# Patient Record
Sex: Female | Born: 1955
Health system: Southern US, Community
[De-identification: ages and names within clinical notes are randomized; demographics above are authoritative.]

## PROBLEM LIST (undated history)

## (undated) DIAGNOSIS — E7800 Pure hypercholesterolemia, unspecified: Secondary | ICD-10-CM

## (undated) DIAGNOSIS — R109 Unspecified abdominal pain: Secondary | ICD-10-CM

## (undated) DIAGNOSIS — M1990 Unspecified osteoarthritis, unspecified site: Secondary | ICD-10-CM

## (undated) DIAGNOSIS — B009 Herpesviral infection, unspecified: Secondary | ICD-10-CM

## (undated) DIAGNOSIS — N871 Moderate cervical dysplasia: Secondary | ICD-10-CM

## (undated) DIAGNOSIS — I10 Essential (primary) hypertension: Secondary | ICD-10-CM

## (undated) DIAGNOSIS — A64 Unspecified sexually transmitted disease: Secondary | ICD-10-CM

## (undated) DIAGNOSIS — M8580 Other specified disorders of bone density and structure, unspecified site: Secondary | ICD-10-CM

## (undated) HISTORY — DX: Moderate cervical dysplasia: N87.1

## (undated) HISTORY — DX: Herpesviral infection, unspecified: B00.9

## (undated) HISTORY — DX: Unspecified abdominal pain: R10.9

## (undated) HISTORY — DX: Essential (primary) hypertension: I10

## (undated) HISTORY — DX: Other specified disorders of bone density and structure, unspecified site: M85.80

## (undated) HISTORY — PX: APPENDECTOMY: SHX54

## (undated) HISTORY — DX: Unspecified sexually transmitted disease: A64

## (undated) HISTORY — PX: OVARIAN CYST SURGERY: SHX726

## (undated) HISTORY — DX: Pure hypercholesterolemia, unspecified: E78.00

---

## 1978-10-13 DIAGNOSIS — B009 Herpesviral infection, unspecified: Secondary | ICD-10-CM

## 1978-10-13 DIAGNOSIS — A64 Unspecified sexually transmitted disease: Secondary | ICD-10-CM

## 1978-10-13 HISTORY — DX: Unspecified sexually transmitted disease: A64

## 1978-10-13 HISTORY — DX: Herpesviral infection, unspecified: B00.9

## 1983-10-14 HISTORY — PX: TUBAL LIGATION: SHX77

## 1998-01-16 ENCOUNTER — Other Ambulatory Visit: Admission: RE | Admit: 1998-01-16 | Discharge: 1998-01-16 | Payer: Self-pay | Admitting: Internal Medicine

## 2000-03-03 ENCOUNTER — Encounter: Admission: RE | Admit: 2000-03-03 | Discharge: 2000-03-03 | Payer: Self-pay | Admitting: Family Medicine

## 2000-03-03 ENCOUNTER — Encounter: Payer: Self-pay | Admitting: Family Medicine

## 2002-11-25 ENCOUNTER — Other Ambulatory Visit: Admission: RE | Admit: 2002-11-25 | Discharge: 2002-11-25 | Payer: Self-pay | Admitting: Obstetrics and Gynecology

## 2003-12-15 ENCOUNTER — Encounter: Admission: RE | Admit: 2003-12-15 | Discharge: 2003-12-15 | Payer: Self-pay | Admitting: *Deleted

## 2004-01-02 ENCOUNTER — Other Ambulatory Visit: Admission: RE | Admit: 2004-01-02 | Discharge: 2004-01-02 | Payer: Self-pay | Admitting: Obstetrics and Gynecology

## 2005-01-02 ENCOUNTER — Other Ambulatory Visit: Admission: RE | Admit: 2005-01-02 | Discharge: 2005-01-02 | Payer: Self-pay | Admitting: Obstetrics and Gynecology

## 2006-01-06 ENCOUNTER — Other Ambulatory Visit: Admission: RE | Admit: 2006-01-06 | Discharge: 2006-01-06 | Payer: Self-pay | Admitting: Obstetrics & Gynecology

## 2006-05-14 ENCOUNTER — Encounter: Admission: RE | Admit: 2006-05-14 | Discharge: 2006-05-14 | Payer: Self-pay | Admitting: Obstetrics and Gynecology

## 2007-04-27 ENCOUNTER — Other Ambulatory Visit: Admission: RE | Admit: 2007-04-27 | Discharge: 2007-04-27 | Payer: Self-pay | Admitting: Obstetrics and Gynecology

## 2007-05-19 ENCOUNTER — Encounter: Admission: RE | Admit: 2007-05-19 | Discharge: 2007-05-19 | Payer: Self-pay | Admitting: Obstetrics & Gynecology

## 2008-06-01 ENCOUNTER — Encounter: Admission: RE | Admit: 2008-06-01 | Discharge: 2008-06-01 | Payer: Self-pay | Admitting: Obstetrics and Gynecology

## 2008-06-20 ENCOUNTER — Other Ambulatory Visit: Admission: RE | Admit: 2008-06-20 | Discharge: 2008-06-20 | Payer: Self-pay | Admitting: Obstetrics & Gynecology

## 2008-10-03 ENCOUNTER — Encounter: Admission: RE | Admit: 2008-10-03 | Discharge: 2008-10-03 | Payer: Self-pay | Admitting: Internal Medicine

## 2008-10-13 DIAGNOSIS — N871 Moderate cervical dysplasia: Secondary | ICD-10-CM

## 2008-10-13 HISTORY — DX: Moderate cervical dysplasia: N87.1

## 2008-10-13 HISTORY — PX: ABDOMINAL HYSTERECTOMY: SHX81

## 2008-10-13 HISTORY — PX: LAPAROSCOPIC ASSISTED VAGINAL HYSTERECTOMY: SHX5398

## 2008-10-24 ENCOUNTER — Encounter: Payer: Self-pay | Admitting: Obstetrics & Gynecology

## 2008-10-24 ENCOUNTER — Ambulatory Visit (HOSPITAL_COMMUNITY): Admission: RE | Admit: 2008-10-24 | Discharge: 2008-10-25 | Payer: Self-pay | Admitting: Obstetrics & Gynecology

## 2009-10-31 ENCOUNTER — Encounter: Admission: RE | Admit: 2009-10-31 | Discharge: 2009-10-31 | Payer: Self-pay | Admitting: Obstetrics and Gynecology

## 2011-01-27 LAB — BASIC METABOLIC PANEL
CO2: 23 mEq/L (ref 19–32)
Calcium: 9.4 mg/dL (ref 8.4–10.5)
Chloride: 103 mEq/L (ref 96–112)
Creatinine, Ser: 0.85 mg/dL (ref 0.4–1.2)
GFR calc Af Amer: >60 mL/min (ref >60)
Glucose, Bld: 157 mg/dL — ABNORMAL HIGH (ref 70–99)
Potassium: 3.7 mEq/L (ref 3.5–5.1)
Sodium: 132 mEq/L — ABNORMAL LOW (ref 135–145)
Sodium: 135 mEq/L (ref 135–145)

## 2011-01-27 LAB — CBC
HCT: 30 % — ABNORMAL LOW (ref 36.0–46.0)
Hemoglobin: 10.4 g/dL — ABNORMAL LOW (ref 12.0–15.0)
MCHC: 33.8 g/dL (ref 30.0–36.0)
MCHC: 34.6 g/dL (ref 30.0–36.0)
MCV: 98.4 fL (ref 78.0–100.0)
MCV: 98.6 fL (ref 78.0–100.0)
Platelets: 279 10*3/uL (ref 150–400)
RDW: 13.2 % (ref 11.5–15.5)
WBC: 8.5 10*3/uL (ref 4.0–10.5)

## 2011-01-27 LAB — URINALYSIS, ROUTINE W REFLEX MICROSCOPIC
Bilirubin Urine: NEGATIVE
Hgb urine dipstick: NEGATIVE
Nitrite: NEGATIVE
Protein, ur: NEGATIVE mg/dL
Urobilinogen, UA: 0.2 mg/dL (ref 0.0–1.0)

## 2011-01-27 LAB — PROTIME-INR
INR: 1 (ref 0.00–1.49)
Prothrombin Time: 13.5 seconds (ref 11.6–15.2)

## 2011-01-27 LAB — PREGNANCY, URINE: Preg Test, Ur: NEGATIVE

## 2011-02-19 ENCOUNTER — Other Ambulatory Visit: Payer: Self-pay | Admitting: Obstetrics & Gynecology

## 2011-02-19 DIAGNOSIS — Z1231 Encounter for screening mammogram for malignant neoplasm of breast: Secondary | ICD-10-CM

## 2011-02-25 NOTE — Discharge Summary (Signed)
NAMEDAYRA, RAPLEY                ACCOUNT NO.:  [id number]   MEDICAL RECORD NO.:  [id number]          PATIENT TYPE:  OIB   LOCATION:  9318                          FACILITY:  [facility]   PHYSICIAN:  M. Leda Quail, MD  DATE OF BIRTH:  1956/05/28   DATE OF ADMISSION:  10/24/2008  DATE OF DISCHARGE:  10/25/2008                               DISCHARGE SUMMARY   ADMISSION DIAGNOSES:  71. A 55 year old G2, P2 married white female with a large cystic 11 cm      left-sided mass.  2. History of menorrhagia.  3. Fibroids.  4. Hypertension.   POSTOPERATIVE DIAGNOSES:  63. A 55 year old G2, P2 a white female with a large cystic 11 cm left-      sided mass.  2. History of menorrhagia.  3. Fibroids.  4. Hypertension.  5. Endometriosis.   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   HISTORY OF PRESENT ILLNESS:  The patient's H and P is written in the  chart, however, in brief, this patient is a 55 year old G2, P2 married  white female with a large cystic mass that was found by Dr. Valentina Lucks on  physical exam, the latter part of December.  She underwent a CAT scan  that showed and confirmed a pelvic/abdominal mass, it did appear cystic  without any nodularity associated with it.  There was no increased  vascularity to it.  There was no ascites and no solid component.  Was  very smooth feeling on exam.  I did obtain CA125 on her, which was also  normal.  I counseled her about the benign nature of this, but because of  its size, recommended removal.  She has been on birth control pills long-  term for control of menorrhagia, as she does have some fibroids in her  uterus as well.  She decided to proceed with a hysterectomy at the same  time.  We discussed removal of both ovaries because of age and concerns  for any changes or any possible occurrence of other cysts on  the right  side and ultimately she decided to proceed with this as well.  She is  recently been diagnosed with hypertension and is on hydrochlorothiazide  and triamterene combo medication.  Otherwise, her past medical history  is only significant for removal of a cyst and appendectomy at age 55.   HOSPITAL COURSE:  The patient was admitted to Same Day Surgery.  She was  taken to the operating room.  Initially, laparoscopic left salpingo-  oophorectomy was performed and this ovary was sent to Pathology for  frozen section with the above findings noted.  Then the right ovary, and  an LAVH was performed.  The patient tolerated the procedure well.  She  had 200 mL of blood loss.  After the surgery was complete, she went to  the recovery room and after appropriate time there, to the third floor  for the remainder of her hospitalization.   She was seen in the evening of  postoperative day 1.  She was awake and  alert.  She had excellent vital signs.  She had occasional bowel sounds.  She had a little bit of nausea.  Incisions were clean, dry, and intact.  She was making good urine output.  By postoperative day 1, her pain was  under excellent control.  Her nausea was completely resolved.  She was  awake and alert.  T-max was 98.4, blood pressure has ranged from 93 to  138 over 59 to 90.  Pulse 69-84.  She had excellent urine output with  2000 mL of urine output.   Postop labs showed a white count of 94, hemoglobin of 10.4 and platelet  count of 184.  Electrolytes; sodium 137, potassium 3.7, BUN and  creatinine 4 and 0.7 respectively.  Glucose 157.   On exam, her abdomen was soft, nontender, and nondistended with normal  bowel sounds.  Her incisions were clean, dry, and intact.  Her perineum  was dry.  Her Foley catheter was removed.  She was advanced to regular  diet.  Oxygen was removed.  She is up to ambulate.  If the patient is  able to tolerate regular diet this morning and void without  difficulty,  she will be discharged to home later today.   DISCHARGE INSTRUCTIONS:  Have been provided to her in the written and  verbal form.  She will see me in 1 week for initial postop followup.  She has been given prescriptions for Percocet 5/325 1-2 tablets p.o. 4-6  hours p.r.n. pain and Motrin 800 mg 1 tablet every 8 hours as needed for  pain.  The patient will be discharged to home later today with her  husband, if today proceeds as expected.      Lum Keas, MD  Electronically Signed     MSM/MEDQ  D:  10/25/2008  T:  10/25/2008  Job:  [id number]

## 2011-02-25 NOTE — Op Note (Signed)
[name], [name]                ACCOUNT NO.:  [id number]   MEDICAL RECORD NO.:  [id number]          PATIENT TYPE:  OIB   LOCATION:  9318                          FACILITY:  [facility]   PHYSICIAN:  M. Leda Quail, MD  DATE OF BIRTH:  01-24-1956   DATE OF PROCEDURE:  10/24/2008  DATE OF DISCHARGE:                               OPERATIVE REPORT   PER OPERATIVE DIAGNOSES:  68. A 55 year old gravida 2, para 2 married white female with large      cystic 11-cm, left adnexal mass.  2. Menorrhagia controlled with birth control pills.  3. History bilateral tubal ligation in 1985.  4. Fibroid uterus.  5. History of hypertension.  6. History of normal spontaneous vaginal delivery x2.   POSTOPERATIVE DIAGNOSES:  54. A 55 year old gravida 2, para 2 married white female with large      cystic 11-cm, left adnexal mass.  2. Menorrhagia controlled with birth control pills.  3. History bilateral tubal ligation in 1985.  4. Fibroid uterus.  5. History of hypertension.  6. History of normal spontaneous vaginal delivery x2.  7. Endometriosis.   PROCEDURES:  Laparoscopic-assisted vaginal hysterectomy with bilateral  salpingo-oophorectomy.   SURGEON:  M. Leda Quail, MD   ASSISTANT:  Edwena Felty. Romine, M.D.   ANESTHESIA:  General endotracheal, Dr. Arby Barrette oversaw the case.   FINDINGS:  1. A large, smooth left ovarian mass with some adhesions inferiorly to      the bowel.  These were filmy.  2. Cyst on the right ovary.  3. Fibroid uterus.  4. Endometriosis on the right uterosacral ligament.   SPECIMENS:  Uterus, cervix, bilateral tubes, and ovaries sent to  Pathology.  Frozen section of the left ovary revealed a benign serous  cyst.   ESTIMATED BLOOD LOSS:  200 mL   URINE OUTPUT:  500 mL of clear urine in Foley catheter.   FLUIDS:  2200 mL of LR.   COMPLICATIONS:  None.   INDICATIONS:  Carmen Ball is a very nice 55 year old G2, P2 married  white female who was diagnosed with a  pelvic/abdominal mass after seeing  Dr. Kirby Funk at the very end of December.  She would see him for a  regular physical exam and to discuss blood pressure elevation.  He  palpated a abdominal masses and send her for a CT, which was done on  October 03, 2008 showing 11.9 x 9.2 x 11.7 cm cystic lesion.  She had a  CA-125, which was normal and I have talked to her about the size of this  lesion as well as the probability of its benign nature.  She was  interested in surgical removal.  She also was interested in hysterectomy  because of being on long-term birth control pills secondary menorrhagia.  She had fibroids that were noted on the CAT scan as well.  Ultimately,  she decided to proceed with bilateral oophorectomy with her  hysterectomy.  She was counseled about the risks and benefits of this,  this was documented in her office chart.  She has undergone preoperative  evaluation and is  here today for surgery.   DESCRIPTION OF PROCEDURE:  The patient is taken to the operating room.  She is placed in supine position.  General endotracheal anesthesia  administered by the anesthesia staff without difficulty.  Legs  positioned in [place] stirrups.  SCD devices were on lower extremities  bilaterally.  Arms were tucked bilaterally.  Legs were initially lifted  to the high lithotomy position.  Abdomen, perineum, inner thighs, and  vagina are prepped in normal sterile fashion.  Attention is turned  towards the vagina.  A bivalve speculum was placed in the vagina.  The  anterior lip of cervix was grasped single-tooth tenaculum.  Hulka clamp  was attached to the uterus as a means of manipulating the uterus during  the surgery.  The tenaculum was removed from the cervix.  The bivalve  speculum was removed from vagina.  Foley catheter was inserted into the  bladder under sterile conditions.  Clear urine is noted to drain into  the catheter, which is on straight drain.  Legs were positioned in  the  low lithotomy position.  She is draped in normal sterile fashion.  Attention is turned towards the abdomen.  An exam under anesthesia was  performed.  The ovarian mass does not come across any further in the  midline, but it does lift up pretty easily and also feels quite smooth.  A decision was made to go ahead and proceed laparoscopically.  The  location for incision is made approximately 4 to 5 cm above the  umbilicus due to the size of the ovarian mass and the need to be able to  visualize with the laparoscope.  The skin was anesthetized with 0.25%  Marcaine.  Then a skin incision was made with a #11 blade.  Subcutaneous  fat tissue is dissected.  The fascia is identified with retraction of  using curved retractors.  The fascia was grasped with a Kocher clamp and  elevated.  The fascia was incised using curved Mayo scissors.  The  peritoneum is then popped through easily and the retractors placed intra-  abdominally.  Operator's index finger was used to inspect the abdomen  and there is no adhesions noted right at the level of the midline  incision.  A pursestring suture was placed around the fascia.  Then a  Roseanne Reno is placed through this incision, was attached to the skin with  the sutures around the pursestring suture.  Using a laparoscope  intraperitoneal placement was confirmed.  CO2 gas was attached and  pneumoperitoneum was achieved with low pressures.  The patient is placed  in Trendelenburg positioning.  Decision was made to go ahead and place  ports in the right and left lower quadrant.  The skin was  transilluminated to watch for vessels.  Then incision site locations are  chosen.  Skin was anesthetized with 0.25% Marcaine.  The skin incision  was made with a #11 blade.  A 5-mm trocars and ports are inserted into  the right and left lower quadrant under direct visualization of the  laparoscope.  Blunt trocars were used.  These were removed.  The blunt  probe and grasper  was used.  The adhesions of the ovaries to the bowels  inferiorly were noted.  These are freed using a tripolar gyrus without  difficulty.  They were relatively filmy and they were freed just by  using the cut setting.  Once the ovary was completely mobile and free,  decision was made to go  ahead and decompress it.  A spinal needle was  obtained.  This was passed through the midline of the abdomen and placed  into the cyst.  The cyst was drained.  Approximately 400 mL of clear  fluid was drained from the cyst collapsing it almost completely.  Once  this was done, the ureters on both sides were identified.  The left  ovary was placed on some stretch to the opposite side and the IP  ligament was well visualized.  Using a tripolar gyrus, the pedicle was  serially clamped, cauterized, and incised.  Cauterization used sound  indicator to ensure complete cautery was performed.  Once the ovary was  completely freed from the left sidewall then the utero-ovarian pedicle  was serially clamped, cauterized, and cut.  The ovary was brought  through the midline incision and sent for frozen section.  At this  point, attention was turned to the right ovary.  The IP ligament was  serially clamped, cauterized and incised until it was completely  traversed.  Then the right round ligament was clamped, cauterized, and  incised.  The broad ligament was then serially clamped, cauterized, and  incised down approximately to the level of the internal os of the  cervix.  At this point, peritoneum was elevated and using Endoscopic  scissors, peritoneal reflection was incised.  This began the creation of  the bladder flap.  Attention was then turned to the left side with the  uterus placed on stretch to the patient's right.  The round ligament on  the left side was serially clamped, cauterized, and incised.  Then a  broad ligament was serially clamped, cauterize incised again down to  about the level of the internal  os.  The peritoneum at this point was  incised to the midline to connect with previous incision done on the  opposite side.  At this point, the remainder of the procedure was  performed vaginally.  The instruments were removed from the abdomen.  Pneumoperitoneum was relieved.  Legs positioned in the high lithotomy  position.  Attention was then turned towards the vagina.  A heavy weight  speculum placed in the posterior vaginal fornix.  A Jacobson tenaculum  used to grasp the cervix.  The cervix was anesthetized with 1% lidocaine  mixed 1:1 with epinephrine (1:100,000 units).  The posterior cul-de-sac  was entered sharply.  The posterior peritoneum and posterior vaginal  mucosa were attached with figure-of-eight suture and #0 Vicryl.  Long  banana speculum was placed through this incision and a heavy weight  speculum was removed.  The uterosacral ligaments on each side were  serially clamped, incised and Heaney sutured with #0 Vicryl.  Then the  cervix was circumferentially incised with cautery.  Using Mayo scissors  the pubovesical cervical fascia was incised until the plane between  bladder and the cervix was identified.  A Dever retractor was placed in  this incision.  The cardinal ligaments were serially clamped, cut and  suture ligated with # 0 Vicryl.  The uterine arteries once they were  reached, were clamped, cut and suture ligated twice with #0 Vicryl.  Then the remainder of the broad ligament was serially clamped, cut, and  suture ligated with #0 Vicryl.  Once the pedicles remaining were quite  small, the fundus of the uterus was delivered through the vagina.  The  remaining pedicle was traversed with a Heaney clamp and then the pedicle  was cut and the pedicles were tied with  a free tie of #0 Vicryl.  There  was a small amount of bleeding at one pedicle on the patient's right  side.  This was grasped with an Allis clamp and a figure-of-eight suture  with #0 Vicryl was used to  make this hemostatic.  Then pelvis was  irrigated.  No bleeding was noted except along the inferior cuff edge.  The banana speculum was removed and a short heavy weight speculum was  placed.  The cuff was then run with a long 0 Vicryl starting at the 2  o'clock position.  The anterior vaginal mucosa and anterior peritoneum  were incorporated into the stitch.  A running interlocking stitch is  brought around the cervix incorporating the posterior vaginal mucosa and  posterior peritoneum and then ended at 10 o'clock.  At this point,  posterior vaginal mucosa was hemostatic.  The cuff was closed with 6  figure-of-eight sutures of #0 Vicryl.  No bleeding was noted from the  cuff.  Vagina was irrigated.  All instruments were removed from the  vagina.  A CO2 gas was attached back to the [place].  The  pneumoperitoneum was reachieved.  Under visualization of the  laparoscope, all pedicles were inspected.  No bleeding was noted.  The  cuff was hemostatic.  Ureter peristalsis noted bilaterally.  At this  point, the procedure was ended.  The 2 inferior ports were removed under  direct visualization of the laparoscope after all instruments were  removed.  The patient was placed in supine at this point.  The  pneumoperitoneum was relieved.  The midline port was left in place until  several deep breaths were given to the patient to help get any gas in  the abdomen out.  Then the midline port was removed.  The pursestring  suture was then brought together to close the fascial incision.  The  skin above the umbilicus was closed with subcuticular stitch of 3-0  Vicryl.  The skin was cleansed at all 3 incision locations and then  closed with Dermabond.  At this point, the patient was completely  positioned back in supine position.  Sponge, lap, needle and sponge  counts were correct x2.  She was awaken from anesthesia, extubated, and  taken to the recovery room in stable addition.      Lum Keas, MD  Electronically Signed     MSM/MEDQ  D:  10/24/2008  T:  10/25/2008  Job:  [id number]   cc:   Thora Lance, M.D.  Fax: [phone]

## 2011-03-13 ENCOUNTER — Ambulatory Visit
Admission: RE | Admit: 2011-03-13 | Discharge: 2011-03-13 | Disposition: A | Discharge status: Home / Self Care | Payer: BC Managed Care – PPO | Source: Ambulatory Visit | Attending: Obstetrics & Gynecology | Admitting: Obstetrics & Gynecology

## 2011-03-13 DIAGNOSIS — Z1231 Encounter for screening mammogram for malignant neoplasm of breast: Secondary | ICD-10-CM

## 2011-08-22 ENCOUNTER — Other Ambulatory Visit: Payer: Self-pay | Admitting: Obstetrics & Gynecology

## 2011-08-22 DIAGNOSIS — Z780 Asymptomatic menopausal state: Secondary | ICD-10-CM

## 2012-02-11 ENCOUNTER — Other Ambulatory Visit: Payer: Self-pay | Admitting: Obstetrics & Gynecology

## 2012-02-11 DIAGNOSIS — Z1231 Encounter for screening mammogram for malignant neoplasm of breast: Secondary | ICD-10-CM

## 2012-03-15 ENCOUNTER — Ambulatory Visit
Admission: RE | Admit: 2012-03-15 | Discharge: 2012-03-15 | Disposition: A | Discharge status: Home / Self Care | Payer: BC Managed Care – PPO | Source: Ambulatory Visit | Attending: Obstetrics & Gynecology | Admitting: Obstetrics & Gynecology

## 2012-03-15 DIAGNOSIS — Z780 Asymptomatic menopausal state: Secondary | ICD-10-CM

## 2012-03-15 DIAGNOSIS — Z1231 Encounter for screening mammogram for malignant neoplasm of breast: Secondary | ICD-10-CM

## 2012-09-22 ENCOUNTER — Encounter (HOSPITAL_COMMUNITY): Payer: Self-pay | Admitting: Emergency Medicine

## 2012-09-22 ENCOUNTER — Emergency Department (HOSPITAL_COMMUNITY)
Admission: EM | Admit: 2012-09-22 | Discharge: 2012-09-22 | Disposition: A | Discharge status: Home / Self Care | Payer: BC Managed Care – PPO | Attending: Emergency Medicine | Admitting: Emergency Medicine

## 2012-09-22 ENCOUNTER — Emergency Department (HOSPITAL_COMMUNITY): Payer: BC Managed Care – PPO

## 2012-09-22 DIAGNOSIS — K8050 Calculus of bile duct without cholangitis or cholecystitis without obstruction: Secondary | ICD-10-CM

## 2012-09-22 DIAGNOSIS — R110 Nausea: Secondary | ICD-10-CM

## 2012-09-22 DIAGNOSIS — Z79899 Other long term (current) drug therapy: Secondary | ICD-10-CM | POA: Insufficient documentation

## 2012-09-22 DIAGNOSIS — M549 Dorsalgia, unspecified: Secondary | ICD-10-CM

## 2012-09-22 DIAGNOSIS — K8020 Calculus of gallbladder without cholecystitis without obstruction: Secondary | ICD-10-CM

## 2012-09-22 DIAGNOSIS — R1011 Right upper quadrant pain: Secondary | ICD-10-CM

## 2012-09-22 LAB — COMPREHENSIVE METABOLIC PANEL
Albumin: 4.2 g/dL (ref 3.5–5.2)
BUN: 17 mg/dL (ref 6–23)
Chloride: 100 mEq/L (ref 96–112)
Creatinine, Ser: 0.67 mg/dL (ref 0.50–1.10)
Total Bilirubin: 0.5 mg/dL (ref 0.3–1.2)

## 2012-09-22 LAB — URINALYSIS, ROUTINE W REFLEX MICROSCOPIC
Hgb urine dipstick: NEGATIVE
Nitrite: NEGATIVE
Protein, ur: NEGATIVE mg/dL
Specific Gravity, Urine: 1.03 (ref 1.005–1.030)
Urobilinogen, UA: 0.2 mg/dL (ref 0.0–1.0)

## 2012-09-22 LAB — LIPASE, BLOOD: Lipase: 41 U/L (ref 11–59)

## 2012-09-22 MED ORDER — HYDROMORPHONE HCL PF 1 MG/ML IJ SOLN
0.5000 mg | Freq: Once | INTRAMUSCULAR | Status: AC
  Administered 2012-09-22: 0.5 mg via INTRAVENOUS
  Filled 2012-09-22: qty 1

## 2012-09-22 MED ORDER — ONDANSETRON HCL 4 MG/2ML IJ SOLN
4.0000 mg | Freq: Once | INTRAMUSCULAR | Status: AC
  Administered 2012-09-22: 4 mg via INTRAVENOUS
  Filled 2012-09-22: qty 2

## 2012-09-22 MED ORDER — ONDANSETRON 4 MG PO TBDP: 4.0000 mg | ORAL_TABLET | Freq: Three times a day (TID) | ORAL | Status: DC | PRN

## 2012-09-22 MED ORDER — KETOROLAC TROMETHAMINE 15 MG/ML IJ SOLN
15.0000 mg | Freq: Once | INTRAMUSCULAR | Status: AC
  Administered 2012-09-22: 15 mg via INTRAVENOUS
  Filled 2012-09-22 (×2): qty 1

## 2012-09-22 MED ORDER — OXYCODONE-ACETAMINOPHEN 5-325 MG PO TABS: 1.0000 | ORAL_TABLET | ORAL | Status: DC | PRN

## 2012-09-22 MED ORDER — SODIUM CHLORIDE 0.9 % IV SOLN
Freq: Once | INTRAVENOUS | Status: AC
  Administered 2012-09-22: 11:00:00 via INTRAVENOUS

## 2012-09-22 NOTE — ED Provider Notes (Signed)
History     CSN: [id number]  Arrival date & time 09/22/12  0901   First MD Initiated Contact with Patient 09/22/12 [id number]      Chief Complaint  Patient presents with  . Back Pain  . Abdominal Cramping    8 hr hx of back and abd pain   (Consider location/radiation/quality/duration/timing/severity/associated sxs/prior treatment)  HPIJune F Ball is a 56 y.o. female with a remote smoking history, status post abdominal hysterectomy and appendectomy presents with severe pain in the right upper quadrant radiating to the back. Patient has some history of mild occasional neck pain but nothing chronic. Patient says she has no history of stomach problems, liver problems or gallbladder disease. She has some mild hypertension somewhat result his ALT with many weight loss medicine. She said this pain awoke her this morning was about to 7-8/10, as an 8 to start in the back she is pointing to the thoracic and lumbar junction, and goes around to underneath the right rib cage, he can be positional.  She does not have a history of kidney stones. She denies any fevers or chills. She does endorse some nausea and vomiting. No diarrhea. No chest pains or shortness of breath.  History reviewed. No pertinent past medical history.  Past Surgical History  Procedure Date  . Abdominal hysterectomy   . Ovarian cyst surgery   . Appendectomy     Family History  Problem Relation Age of Onset  . Cancer Father   . Hypertension Brother   . Hypertension Other     History  Substance Use Topics  . Smoking status: Never Smoker   . Smokeless tobacco: Not on file  . Alcohol Use: Yes    OB History    Grav Para Term Preterm Abortions TAB SAB Ect Mult Living                  Review of Systems At least 10pt or greater review of systems completed and are negative except where specified in the HPI.  Allergies  Review of patient's allergies indicates no known allergies.  Home Medications   Current Outpatient  Rx  Name  Route  Sig  Dispense  Refill  . ESTRADIOL 1 MG PO TABS   Oral   Take 1 mg by mouth daily.         Marland Kitchen LISINOPRIL-HYDROCHLOROTHIAZIDE 10-12.5 MG PO TABS   Oral   Take 1 tablet by mouth daily.           BP 139/79  Pulse 89  Temp 97.7 F (36.5 C) (Oral)  Resp 20  SpO2 98%  Physical Exam  ED Course  [place] bedside Date/Time: 09/22/2012 11:29 AM Performed by: Jones Skene Authorized by: Jones Skene Consent: Verbal consent obtained. Consent given by: patient Comments: Bedside ultrasound reveals a normal abdominal aorta, images have been recorded, aortic diameter was less than 2 cm from the diaphragm and just superior to the SMA down to the bifurcation of the iliacs. Also did bedside ultrasound of the gallbladder which shows innumerable gallstones, normal thickness gallbladder wall, no pericholecystic fluid and questionable dilated duct.    (including critical care time)  Labs Reviewed  COMPREHENSIVE METABOLIC PANEL - Abnormal; Notable for the following:    Sodium 134 (*)     Glucose, Bld 103 (*)     All other components within normal limits  URINALYSIS, ROUTINE W REFLEX MICROSCOPIC - Abnormal; Notable for the following:    APPearance CLOUDY (*)  Ketones, ur TRACE (*)     All other components within normal limits  LIPASE, BLOOD  LAB REPORT - SCANNED   US Abdomen Complete  09/22/2012  *RADIOLOGY REPORT*  Clinical Data:  Crampy abdominal pain.  COMPLETE ABDOMINAL ULTRASOUND  Comparison:  No priors.  Findings:  Gallbladder:  Multiple small echogenic foci within the gallbladder demonstrating distal acoustic shadowing, compatible with small gallstones.  Gallbladder does not appear distended.  Normal wall thickness.  No pericholecystic fluid.  Negative Murphy's sign.  Common bile duct:  Normal caliber measuring 6.1 mm in the porta hepatis.  Liver:  Normal size and echotexture without focal parenchymal abnormality.  Patent portal vein with hepatopetal flow.  IVC:   Patent throughout its visualized course in the abdomen.  Pancreas:  Although the pancreas is difficult to visualize in its entirety, no focal pancreatic abnormality is identified.  Spleen:  Normal size and echotexture without focal parenchymal abnormality.  6.7 cm.  Right Kidney:  No hydronephrosis.  Well-preserved cortex.  Normal size and parenchymal echotexture without focal abnormalities.  9.7 cm in length.  Left Kidney:  No hydronephrosis.  Well-preserved cortex.  Normal size and parenchymal echotexture without focal abnormalities. 10.6 cm in length.  Abdominal aorta:  Normal caliber measuring up to 1.8 cm in diameter and tapers appropriately distally.  IMPRESSION: 1.  Cholelithiasis without findings to suggest acute cholecystitis. 2.  No other acute findings in the abdomen.   Original Report Authenticated By: Trudie Reed, M.D.      1. Cholelithiasis   2. Biliary colic   3. Right upper quadrant pain   4. Back pain   5. Nausea       MDM  Kripa F Hamelin is a 56 y.o. female presenting with some right upper quadrant pain concern for biliary colic versus acute cholecystitis versus acute gallstone pancreatitis. Patient's pain has somewhat resolved with pain medicine, CT scan in the emergency department. Urinalysis is negative for blood, and couplets metabolic panel is unremarkable. Patient was sent for formal study of her upper quadrant because a bedside ultrasound patient's gallbladder does have significant amount of stones. It did appear like the duct may have been slightly enlarged as it's cured by stones on my bedside exam. Duct appears normal on formal ultrasound study with no cholecystitis, pericholecystic fluid et Karie Soda. Without any increased liver enzymes, or evidence of cholecystitis a right upper quadrant ultrasound, patient will be amenable to outpatient treatment for her symptomatic cholelithiasis and biliary colic. Given her referral to [facility] [place] [facility] and instructions on diet  to follow which may minimize her symptoms. Patient also gives prescriptions for pain medicine and nausea medicine as well.  I explained the diagnosis and have given explicit precautions to return to the ER including worsening right upper quadrant pain, fever, vomiting is not amenable to medications, altered mental status or any other new or worsening symptoms. The patient understands and accepts the medical plan as it's been dictated and I have answered their questions. Discharge instructions concerning home care and prescriptions have been given.  The patient is STABLE and is discharged to home in good condition.         Jones Skene, MD 09/23/12 1818

## 2012-09-22 NOTE — ED Notes (Signed)
Pt reports waking to severe midback and abd pain at 0100. Pain increased over last two hrs. Denies NVD  Dr Kirby Funk PCP advised by pt

## 2012-09-22 NOTE — ED Notes (Signed)
US at bedside

## 2012-09-22 NOTE — ED Notes (Signed)
Per patient, feels much better, positive response to pain meds

## 2012-09-22 NOTE — ED Notes (Signed)
Pt c/o waking up with severe mid back pain and epigastric pain.  Denies n/v/d.  Denies GU symptoms.

## 2012-09-23 ENCOUNTER — Encounter (INDEPENDENT_AMBULATORY_CARE_PROVIDER_SITE_OTHER): Payer: Self-pay | Admitting: General Surgery

## 2012-09-23 ENCOUNTER — Ambulatory Visit (INDEPENDENT_AMBULATORY_CARE_PROVIDER_SITE_OTHER): Payer: BC Managed Care – PPO | Admitting: General Surgery

## 2012-09-23 VITALS — BP 120/76 | HR 72 | Temp 97.3°F | Resp 16 | Ht 62.0 in | Wt 138.0 lb

## 2012-09-23 DIAGNOSIS — K8020 Calculus of gallbladder without cholecystitis without obstruction: Secondary | ICD-10-CM | POA: Insufficient documentation

## 2012-09-23 NOTE — Progress Notes (Signed)
Patient ID: Carmen Ball, female   DOB: 1955-12-26, 56 y.o.   MRN: [id number]  Chief Complaint  Patient presents with  . Abdominal Pain    HPI Carmen Ball is a 56 y.o. female.   HPI 56 yo WF referred by Dr Rulon Abide for evaluation of gallstones. The patient was seen in the emergency room yesterday. She awoke around 1 AM with severe back pain that radiated to her upper abdomen. And became concentrated in her upper abdomen underneath her right rib cage.  She states the pain was so intense she felt like she was going to vomit.the pain was so intense it made her go to the emergency room after discussing her symptoms with her primary care physician. She states that she probably has had some milder episodes of right upper quadrant pain in the past but they were mainly very brief in duration and not as intense. She denies any weight loss. She denies any fevers or chills. She denies any melena, hematochezia, acholic stools. She denies any trouble swallowing liquids or solids. She denies any reflux. Past Medical History  Diagnosis Date  . Hypertension   . Abdominal pain     Past Surgical History  Procedure Date  . Ovarian cyst surgery   . Appendectomy   . Abdominal hysterectomy 2010    Family History  Problem Relation Age of Onset  . Cancer Father   . Hypertension Brother   . Hypertension Other   . Heart disease Mother     Social History History  Substance Use Topics  . Smoking status: Former Smoker    Quit date: 09/23/1994  . Smokeless tobacco: Never Used  . Alcohol Use: 1.8 oz/week    3 Glasses of wine per week     Comment: per day    No Known Allergies  Current Outpatient Prescriptions  Medication Sig Dispense Refill  . estradiol (ESTRACE) 1 MG tablet Take 1 mg by mouth daily.      Marland Kitchen lisinopril-hydrochlorothiazide (PRINZIDE,ZESTORETIC) 10-12.5 MG per tablet Take 1 tablet by mouth daily.      . ondansetron (ZOFRAN ODT) 4 MG disintegrating tablet Take 1 tablet (4 mg total) by  mouth every 8 (eight) hours as needed for nausea.  10 tablet  0  . oxyCODONE-acetaminophen (PERCOCET/ROXICET) 5-325 MG per tablet Take 1-2 tablets by mouth every 4 (four) hours as needed for pain.  29 tablet  0   No current facility-administered medications for this visit.   Facility-Administered Medications Ordered in Other Visits  Medication Dose Route Frequency Provider Last Rate Last Dose  . [COMPLETED] 0.9 %  sodium chloride infusion   Intravenous Once John-Adam Bonk, MD      . [COMPLETED] HYDROmorphone (DILAUDID) injection 0.5 mg  0.5 mg Intravenous Once John-Adam Bonk, MD   0.5 mg at 09/22/12 1105  . [COMPLETED] ketorolac (TORADOL) 15 MG/ML injection 15 mg  15 mg Intravenous Once John-Adam Bonk, MD   15 mg at 09/22/12 1234  . [COMPLETED] ondansetron (ZOFRAN) injection 4 mg  4 mg Intravenous Once John-Adam Bonk, MD   4 mg at 09/22/12 1105    Review of Systems Review of Systems  Constitutional: Negative for activity change and appetite change.  HENT: Negative for hearing loss and neck pain.   Eyes: Negative for photophobia, itching and visual disturbance.  Respiratory: Negative for chest tightness, shortness of breath, wheezing and stridor.   Cardiovascular: Negative for chest pain, palpitations and leg swelling.  Gastrointestinal:  See hpi  Genitourinary: Negative for dysuria, hematuria and difficulty urinating.  Musculoskeletal: Negative for joint swelling and arthralgias.  Skin: Negative for color change and pallor.  Neurological: Negative for tremors, seizures, weakness, light-headedness and headaches.  Hematological: Negative for adenopathy. Does not bruise/bleed easily.  Psychiatric/Behavioral: Negative for suicidal ideas. The patient is not nervous/anxious.     Blood pressure 120/76, pulse 72, temperature 97.3 F (36.3 C), temperature source Temporal, resp. rate 16, height 5\' 2"  (1.575 m), weight 138 lb (62.596 kg).  Physical Exam Physical Exam  Vitals  reviewed. Constitutional: She is oriented to person, place, and time. She appears well-developed and well-nourished. No distress.  HENT:  Head: Normocephalic and atraumatic.  Right Ear: External ear normal.  Eyes: Conjunctivae normal are normal. No scleral icterus.  Neck: Neck supple. No tracheal deviation present.  Cardiovascular: Normal rate, regular rhythm and normal heart sounds.   Pulmonary/Chest: Effort normal and breath sounds normal. No respiratory distress. She has no wheezes.  Abdominal: Soft. Bowel sounds are normal. She exhibits no distension. There is no tenderness. There is no rebound and no guarding.    Musculoskeletal: She exhibits no edema.  Neurological: She is alert and oriented to person, place, and time.  Skin: Skin is warm and dry. No rash noted. She is not diaphoretic. No erythema.  Psychiatric: She has a normal mood and affect. Her behavior is normal. Judgment and thought content normal.    Data Reviewed ED note and labs  COMPLETE ABDOMINAL ULTRASOUND  Comparison: No priors.  Findings:  Gallbladder: Multiple small echogenic foci within the gallbladder  demonstrating distal acoustic shadowing, compatible with small  gallstones. Gallbladder does not appear distended. Normal wall  thickness. No pericholecystic fluid. Negative Murphy's sign.  Common bile duct: Normal caliber measuring 6.1 mm in the porta  hepatis.  Liver: Normal size and echotexture without focal parenchymal  abnormality. Patent portal vein with hepatopetal flow.  IVC: Patent throughout its visualized course in the abdomen.  Pancreas: Although the pancreas is difficult to visualize in its  entirety, no focal pancreatic abnormality is identified.  Spleen: Normal size and echotexture without focal parenchymal  abnormality. 6.7 cm.  Right Kidney: No hydronephrosis. Well-preserved cortex. Normal  size and parenchymal echotexture without focal abnormalities. 9.7  cm in length.  Left Kidney: No  hydronephrosis. Well-preserved cortex. Normal  size and parenchymal echotexture without focal abnormalities. 10.6  cm in length.  Abdominal aorta: Normal caliber measuring up to 1.8 cm in diameter  and tapers appropriately distally.  IMPRESSION:  1. Cholelithiasis without findings to suggest acute cholecystitis.  2. No other acute findings in the abdomen.   Assessment    Symptomatic cholelithiasis    Plan    I believe the patient's symptoms are consistent with gallbladder disease.  We discussed gallbladder disease. The patient was given Agricultural engineer. We discussed non-operative and operative management. We discussed the signs & symptoms of acute cholecystitis  I discussed laparoscopic cholecystectomy with IOC in detail.  The patient was given educational material as well as diagrams detailing the procedure.  We discussed the risks and benefits of a laparoscopic cholecystectomy including, but not limited to bleeding, infection, injury to surrounding structures such as the intestine or liver, bile leak, retained gallstones, need to convert to an open procedure, prolonged diarrhea, blood clots such as  DVT, common bile duct injury, anesthesia risks, and possible need for additional procedures.  We discussed the typical post-operative recovery course. I explained that the likelihood of improvement of their  symptoms is good.   [name]. Andrey Campanile, MD, FACS General, Bariatric, & Minimally Invasive Surgery [facility], [place]        [facility] M 09/23/2012, 10:28 AM

## 2012-09-23 NOTE — Patient Instructions (Signed)

## 2012-10-01 ENCOUNTER — Encounter (HOSPITAL_COMMUNITY): Payer: Self-pay | Admitting: Pharmacy Technician

## 2012-10-04 ENCOUNTER — Encounter (HOSPITAL_COMMUNITY): Payer: Self-pay

## 2012-10-04 ENCOUNTER — Encounter (HOSPITAL_COMMUNITY)
Admission: RE | Admit: 2012-10-04 | Discharge: 2012-10-04 | Disposition: A | Discharge status: Home / Self Care | Payer: BC Managed Care – PPO | Source: Ambulatory Visit | Attending: General Surgery | Admitting: General Surgery

## 2012-10-04 ENCOUNTER — Ambulatory Visit (HOSPITAL_COMMUNITY)
Admission: RE | Admit: 2012-10-04 | Discharge: 2012-10-04 | Disposition: A | Discharge status: Home / Self Care | Payer: BC Managed Care – PPO | Source: Ambulatory Visit | Attending: Anesthesiology | Admitting: Anesthesiology

## 2012-10-04 DIAGNOSIS — I10 Essential (primary) hypertension: Secondary | ICD-10-CM | POA: Insufficient documentation

## 2012-10-04 DIAGNOSIS — Z01818 Encounter for other preprocedural examination: Secondary | ICD-10-CM | POA: Insufficient documentation

## 2012-10-04 DIAGNOSIS — Z01812 Encounter for preprocedural laboratory examination: Secondary | ICD-10-CM | POA: Insufficient documentation

## 2012-10-04 HISTORY — DX: Unspecified osteoarthritis, unspecified site: M19.90

## 2012-10-04 LAB — COMPREHENSIVE METABOLIC PANEL
AST: 14 U/L (ref 0–37)
Albumin: 3.9 g/dL (ref 3.5–5.2)
BUN: 14 mg/dL (ref 6–23)
Creatinine, Ser: 0.65 mg/dL (ref 0.50–1.10)
Potassium: 3.5 mEq/L (ref 3.5–5.1)
Total Protein: 7.2 g/dL (ref 6.0–8.3)

## 2012-10-04 LAB — CBC WITH DIFFERENTIAL/PLATELET
Basophils Absolute: 0 10*3/uL (ref 0.0–0.1)
Basophils Relative: 0 % (ref 0–1)
Eosinophils Absolute: 0 10*3/uL (ref 0.0–0.7)
Hemoglobin: 13.9 g/dL (ref 12.0–15.0)
MCH: 33.3 pg (ref 26.0–34.0)
MCHC: 34 g/dL (ref 30.0–36.0)
Monocytes Absolute: 0.7 10*3/uL (ref 0.1–1.0)
Monocytes Relative: 7 % (ref 3–12)
Neutro Abs: 6.5 10*3/uL (ref 1.7–7.7)
Neutrophils Relative %: 67 % (ref 43–77)
RDW: 12.2 % (ref 11.5–15.5)

## 2012-10-04 LAB — SURGICAL PCR SCREEN
MRSA, PCR: NEGATIVE
Staphylococcus aureus: NEGATIVE

## 2012-10-04 NOTE — Pre-Procedure Instructions (Addendum)
20 Carmen Ball  10/04/2012   Your procedure is scheduled on: Friday, October 15, 2012  Report to [facility] at  8:00 AM.  Call this number if you have problems the morning of surgery: [phone]   Remember:   Do not eat food or drink any liquids:After Midnight Thursday.    Take these medicines the morning of surgery with A SIP OF WATER: none             Stop taking any coumadin, aspirin, effient, plavix, and any herbal medications 4-5 days before surgery.   Do not wear jewelry, make-up or nail polish.  Do not wear lotions, powders, or perfumes. You may NOT wear deodorant.   Do not shave 48 hours prior to surgery. Do not bring valuables to the hospital.  Contacts, dentures or bridgework may not be worn into surgery.   Leave suitcase in the car. After surgery it may be brought to your room.  For patients admitted to the hospital, checkout time is 11:00 AM the day of discharge.   Patients discharged the day of surgery will not be allowed to drive home.  Name and phone number of your driver:   Special Instructions: Shower using CHG 2 nights before surgery and the night before surgery.  If you shower the day of surgery use CHG.  Use special wash - you have one bottle of CHG for all showers.  You should use approximately 1/3 of the bottle for each shower.   Please read over the following fact sheets that you were given: Pain Booklet, MRSA Information and Surgical Site Infection Prevention

## 2012-10-14 MED ORDER — DEXTROSE 5 % IV SOLN
2.0000 g | INTRAVENOUS | Status: AC
  Administered 2012-10-15: 2 g via INTRAVENOUS
  Filled 2012-10-14: qty 2

## 2012-10-15 ENCOUNTER — Ambulatory Visit (HOSPITAL_COMMUNITY): Payer: BC Managed Care – PPO

## 2012-10-15 ENCOUNTER — Encounter (HOSPITAL_COMMUNITY): Payer: Self-pay | Admitting: Certified Registered Nurse Anesthetist

## 2012-10-15 ENCOUNTER — Ambulatory Visit (HOSPITAL_COMMUNITY): Payer: BC Managed Care – PPO | Admitting: Certified Registered Nurse Anesthetist

## 2012-10-15 ENCOUNTER — Ambulatory Visit (HOSPITAL_COMMUNITY)
Admission: RE | Admit: 2012-10-15 | Discharge: 2012-10-15 | Disposition: A | Discharge status: Home / Self Care | Payer: BC Managed Care – PPO | Source: Ambulatory Visit | Attending: General Surgery | Admitting: General Surgery

## 2012-10-15 ENCOUNTER — Encounter (HOSPITAL_COMMUNITY)
Admission: RE | Disposition: A | Discharge status: Home / Self Care | Payer: Self-pay | Source: Ambulatory Visit | Attending: General Surgery

## 2012-10-15 DIAGNOSIS — I10 Essential (primary) hypertension: Secondary | ICD-10-CM | POA: Insufficient documentation

## 2012-10-15 DIAGNOSIS — Z90710 Acquired absence of both cervix and uterus: Secondary | ICD-10-CM | POA: Insufficient documentation

## 2012-10-15 DIAGNOSIS — K8010 Calculus of gallbladder with chronic cholecystitis without obstruction: Secondary | ICD-10-CM | POA: Insufficient documentation

## 2012-10-15 DIAGNOSIS — Z87891 Personal history of nicotine dependence: Secondary | ICD-10-CM | POA: Insufficient documentation

## 2012-10-15 DIAGNOSIS — Z9089 Acquired absence of other organs: Secondary | ICD-10-CM | POA: Insufficient documentation

## 2012-10-15 DIAGNOSIS — Z8249 Family history of ischemic heart disease and other diseases of the circulatory system: Secondary | ICD-10-CM | POA: Insufficient documentation

## 2012-10-15 DIAGNOSIS — Z809 Family history of malignant neoplasm, unspecified: Secondary | ICD-10-CM | POA: Insufficient documentation

## 2012-10-15 HISTORY — PX: CHOLECYSTECTOMY: SHX55

## 2012-10-15 SURGERY — LAPAROSCOPIC CHOLECYSTECTOMY WITH INTRAOPERATIVE CHOLANGIOGRAM
Anesthesia: General | Site: Abdomen | Wound class: Contaminated

## 2012-10-15 MED ORDER — OXYCODONE-ACETAMINOPHEN 5-325 MG PO TABS: 1.0000 | ORAL_TABLET | ORAL | Status: DC | PRN

## 2012-10-15 MED ORDER — HYDROMORPHONE HCL PF 1 MG/ML IJ SOLN
0.2500 mg | INTRAMUSCULAR | Status: DC | PRN
  Administered 2012-10-15 (×5): 0.5 mg via INTRAVENOUS
  Filled 2012-10-15 (×2): qty 1

## 2012-10-15 MED ORDER — LIDOCAINE HCL (CARDIAC) 20 MG/ML IV SOLN
INTRAVENOUS | Status: DC | PRN
  Administered 2012-10-15: 50 mg via INTRAVENOUS

## 2012-10-15 MED ORDER — SODIUM CHLORIDE 0.9 % IR SOLN
Status: DC | PRN
  Administered 2012-10-15: 1000 mL

## 2012-10-15 MED ORDER — LACTATED RINGERS IV SOLN
INTRAVENOUS | Status: DC | PRN
  Administered 2012-10-15 (×2): via INTRAVENOUS

## 2012-10-15 MED ORDER — PROPOFOL 10 MG/ML IV BOLUS
INTRAVENOUS | Status: DC | PRN
  Administered 2012-10-15: 160 mg via INTRAVENOUS

## 2012-10-15 MED ORDER — ACETAMINOPHEN 10 MG/ML IV SOLN
INTRAVENOUS | Status: AC
  Filled 2012-10-15: qty 100

## 2012-10-15 MED ORDER — SODIUM CHLORIDE 0.9 % IV SOLN
INTRAVENOUS | Status: DC | PRN
  Administered 2012-10-15: 09:00:00

## 2012-10-15 MED ORDER — FENTANYL CITRATE 0.05 MG/ML IJ SOLN
INTRAMUSCULAR | Status: DC | PRN
  Administered 2012-10-15: 100 ug via INTRAVENOUS
  Administered 2012-10-15: 50 ug via INTRAVENOUS

## 2012-10-15 MED ORDER — 0.9 % SODIUM CHLORIDE (POUR BTL) OPTIME
TOPICAL | Status: DC | PRN
  Administered 2012-10-15: 1000 mL

## 2012-10-15 MED ORDER — BUPIVACAINE-EPINEPHRINE 0.25% -1:200000 IJ SOLN
INTRAMUSCULAR | Status: DC | PRN
  Administered 2012-10-15: 28 mL

## 2012-10-15 MED ORDER — ONDANSETRON HCL 4 MG/2ML IJ SOLN
INTRAMUSCULAR | Status: DC | PRN
  Administered 2012-10-15: 4 mg via INTRAVENOUS

## 2012-10-15 MED ORDER — ROCURONIUM BROMIDE 100 MG/10ML IV SOLN
INTRAVENOUS | Status: DC | PRN
  Administered 2012-10-15: 40 mg via INTRAVENOUS
  Administered 2012-10-15: 10 mg via INTRAVENOUS

## 2012-10-15 MED ORDER — HYDROMORPHONE HCL PF 1 MG/ML IJ SOLN
INTRAMUSCULAR | Status: AC
  Administered 2012-10-15: 0.5 mg via INTRAVENOUS
  Filled 2012-10-15: qty 2

## 2012-10-15 MED ORDER — NEOSTIGMINE METHYLSULFATE 1 MG/ML IJ SOLN
INTRAMUSCULAR | Status: DC | PRN
  Administered 2012-10-15: 4 mg via INTRAVENOUS

## 2012-10-15 MED ORDER — ACETAMINOPHEN 10 MG/ML IV SOLN
1000.0000 mg | Freq: Once | INTRAVENOUS | Status: AC | PRN
  Administered 2012-10-15: 1000 mg via INTRAVENOUS

## 2012-10-15 MED ORDER — ONDANSETRON HCL 4 MG/2ML IJ SOLN: 4.0000 mg | Freq: Once | INTRAMUSCULAR | Status: DC | PRN

## 2012-10-15 MED ORDER — GLYCOPYRROLATE 0.2 MG/ML IJ SOLN
INTRAMUSCULAR | Status: DC | PRN
  Administered 2012-10-15: .6 mg via INTRAVENOUS

## 2012-10-15 MED ORDER — PHENYLEPHRINE HCL 10 MG/ML IJ SOLN
INTRAMUSCULAR | Status: DC | PRN
  Administered 2012-10-15 (×2): 80 ug via INTRAVENOUS

## 2012-10-15 MED ORDER — MIDAZOLAM HCL 5 MG/5ML IJ SOLN
INTRAMUSCULAR | Status: DC | PRN
  Administered 2012-10-15: 2 mg via INTRAVENOUS

## 2012-10-15 MED ORDER — LACTATED RINGERS IV SOLN
INTRAVENOUS | Status: DC
  Administered 2012-10-15: 09:00:00 via INTRAVENOUS

## 2012-10-15 SURGICAL SUPPLY — 49 items
APL SKNCLS STERI-STRIP NONHPOA (GAUZE/BANDAGES/DRESSINGS) ×1
APPLIER CLIP 5 13 M/L LIGAMAX5 (MISCELLANEOUS) ×2
APR CLP MED LRG 5 ANG JAW (MISCELLANEOUS) ×1
BAG SPEC RTRVL LRG 6X4 10 (ENDOMECHANICALS) ×1
BANDAGE ADHESIVE 1X3 (GAUZE/BANDAGES/DRESSINGS) ×6 IMPLANT
BENZOIN TINCTURE PRP APPL 2/3 (GAUZE/BANDAGES/DRESSINGS) ×2 IMPLANT
BLADE SURG ROTATE 9660 (MISCELLANEOUS) IMPLANT
CANISTER SUCTION 2500CC (MISCELLANEOUS) ×2 IMPLANT
CHLORAPREP W/TINT 26ML (MISCELLANEOUS) ×2 IMPLANT
CLIP APPLIE 5 13 M/L LIGAMAX5 (MISCELLANEOUS) ×1 IMPLANT
CLOTH BEACON ORANGE TIMEOUT ST (SAFETY) ×2 IMPLANT
COVER MAYO STAND STRL (DRAPES) ×2 IMPLANT
COVER SURGICAL LIGHT HANDLE (MISCELLANEOUS) ×2 IMPLANT
DECANTER SPIKE VIAL GLASS SM (MISCELLANEOUS) ×2 IMPLANT
DRAPE C-ARM 42X72 X-RAY (DRAPES) ×2 IMPLANT
DRAPE UTILITY 15X26 W/TAPE STR (DRAPE) ×4 IMPLANT
DRSG TEGADERM 4X4.75 (GAUZE/BANDAGES/DRESSINGS) ×2 IMPLANT
ELECT REM PT RETURN 9FT ADLT (ELECTROSURGICAL) ×2
ELECTRODE REM PT RTRN 9FT ADLT (ELECTROSURGICAL) ×1 IMPLANT
GAUZE SPONGE 2X2 8PLY STRL LF (GAUZE/BANDAGES/DRESSINGS) ×1 IMPLANT
GLOVE BIOGEL M STRL SZ7.5 (GLOVE) ×2 IMPLANT
GLOVE BIOGEL PI IND STRL 6.5 (GLOVE) IMPLANT
GLOVE BIOGEL PI IND STRL 7.0 (GLOVE) IMPLANT
GLOVE BIOGEL PI IND STRL 8 (GLOVE) ×1 IMPLANT
GLOVE BIOGEL PI INDICATOR 6.5 (GLOVE) ×1
GLOVE BIOGEL PI INDICATOR 7.0 (GLOVE) ×2
GLOVE BIOGEL PI INDICATOR 8 (GLOVE) ×1
GLOVE ECLIPSE 6.5 STRL STRAW (GLOVE) ×1 IMPLANT
GLOVE SURG SS PI 7.0 STRL IVOR (GLOVE) ×1 IMPLANT
GOWN PREVENTION PLUS XLARGE (GOWN DISPOSABLE) ×2 IMPLANT
GOWN STRL NON-REIN LRG LVL3 (GOWN DISPOSABLE) ×5 IMPLANT
KIT BASIN OR (CUSTOM PROCEDURE TRAY) ×2 IMPLANT
KIT ROOM TURNOVER OR (KITS) ×2 IMPLANT
NS IRRIG 1000ML POUR BTL (IV SOLUTION) ×2 IMPLANT
PAD ARMBOARD 7.5X6 YLW CONV (MISCELLANEOUS) ×2 IMPLANT
POUCH SPECIMEN RETRIEVAL 10MM (ENDOMECHANICALS) ×2 IMPLANT
SCISSORS LAP 5X35 DISP (ENDOMECHANICALS) ×1 IMPLANT
SET CHOLANGIOGRAPH 5 50 .035 (SET/KITS/TRAYS/PACK) ×2 IMPLANT
SET IRRIG TUBING LAPAROSCOPIC (IRRIGATION / IRRIGATOR) ×2 IMPLANT
SLEEVE ENDOPATH XCEL 5M (ENDOMECHANICALS) ×4 IMPLANT
SPECIMEN JAR SMALL (MISCELLANEOUS) ×2 IMPLANT
SPONGE GAUZE 2X2 STER 10/PKG (GAUZE/BANDAGES/DRESSINGS) ×1
SUT MNCRL AB 4-0 PS2 18 (SUTURE) ×2 IMPLANT
SUT VICRYL 0 UR6 27IN ABS (SUTURE) ×1 IMPLANT
TOWEL OR 17X24 6PK STRL BLUE (TOWEL DISPOSABLE) ×2 IMPLANT
TOWEL OR 17X26 10 PK STRL BLUE (TOWEL DISPOSABLE) ×2 IMPLANT
TRAY LAPAROSCOPIC (CUSTOM PROCEDURE TRAY) ×2 IMPLANT
TROCAR XCEL BLUNT TIP 100MML (ENDOMECHANICALS) ×2 IMPLANT
TROCAR XCEL NON-BLD 5MMX100MML (ENDOMECHANICALS) ×2 IMPLANT

## 2012-10-15 NOTE — Interval H&P Note (Signed)
History and Physical Interval Note:  10/15/2012 9:05 AM  Carmen Ball  has presented today for surgery, with the diagnosis of symptomatic cholelithisis  The various methods of treatment have been discussed with the patient and family. After consideration of risks, benefits and other options for treatment, the patient has consented to  Procedure(s) (LRB) with comments: LAPAROSCOPIC CHOLECYSTECTOMY WITH INTRAOPERATIVE CHOLANGIOGRAM (N/A) as a surgical intervention .  The patient's history has been reviewed, patient examined, no change in status, stable for surgery.  I have reviewed the patient's chart and labs.  Questions were answered to the patient's satisfaction.    [name]. Andrey Campanile, MD, FACS General, Bariatric, & Minimally Invasive Surgery [facility], [place]  [facility] M

## 2012-10-15 NOTE — Preoperative (Signed)
Beta Blockers   Reason not to administer Beta Blockers:Not Applicable 

## 2012-10-15 NOTE — Transfer of Care (Signed)
Immediate Anesthesia Transfer of Care Note  Patient: Carmen Ball  Procedure(s) Performed: Procedure(s) (LRB) with comments: LAPAROSCOPIC CHOLECYSTECTOMY WITH INTRAOPERATIVE CHOLANGIOGRAM (N/A)  Patient Location: PACU  Anesthesia Type:General  Level of Consciousness: awake, alert , oriented and patient cooperative  Airway & Oxygen Therapy: Patient Spontanous Breathing and Patient connected to nasal cannula oxygen  Post-op Assessment: Report given to PACU RN, Post -op Vital signs reviewed and stable and Patient moving all extremities X 4  Post vital signs: Reviewed and stable  Complications: No apparent anesthesia complications

## 2012-10-15 NOTE — Anesthesia Procedure Notes (Signed)
Procedure Name: Intubation Date/Time: 10/15/2012 9:37 AM Performed by: Rogelia Boga Pre-anesthesia Checklist: Patient identified, Emergency Drugs available, Suction available, Patient being monitored and Timeout performed Patient Re-evaluated:Patient Re-evaluated prior to inductionOxygen Delivery Method: Circle system utilized Preoxygenation: Pre-oxygenation with 100% oxygen Intubation Type: IV induction Ventilation: Mask ventilation without difficulty Laryngoscope Size: Mac and 4 Grade View: Grade I Tube type: Oral Number of attempts: 1 Airway Equipment and Method: Stylet Placement Confirmation: ETT inserted through vocal cords under direct vision,  positive ETCO2 and breath sounds checked- equal and bilateral Secured at: 21 cm Tube secured with: Tape Dental Injury: Teeth and Oropharynx as per pre-operative assessment

## 2012-10-15 NOTE — H&P (View-Only) (Signed)
Patient ID: Carmen Ball, female   DOB: 01/04/1956, 56 y.o.   MRN: [id number]  Chief Complaint  Patient presents with  . Abdominal Pain    HPI Carmen Ball is a 56 y.o. female.   HPI 56 yo WF referred by Dr Bonk for evaluation of gallstones. The patient was seen in the emergency room yesterday. She awoke around 1 AM with severe back pain that radiated to her upper abdomen. And became concentrated in her upper abdomen underneath her right rib cage.  She states the pain was so intense she felt like she was going to vomit.the pain was so intense it made her go to the emergency room after discussing her symptoms with her primary care physician. She states that she probably has had some milder episodes of right upper quadrant pain in the past but they were mainly very brief in duration and not as intense. She denies any weight loss. She denies any fevers or chills. She denies any melena, hematochezia, acholic stools. She denies any trouble swallowing liquids or solids. She denies any reflux. Past Medical History  Diagnosis Date  . Hypertension   . Abdominal pain     Past Surgical History  Procedure Date  . Ovarian cyst surgery   . Appendectomy   . Abdominal hysterectomy 2010    Family History  Problem Relation Age of Onset  . Cancer Father   . Hypertension Brother   . Hypertension Other   . Heart disease Mother     Social History History  Substance Use Topics  . Smoking status: Former Smoker    Quit date: 09/23/1994  . Smokeless tobacco: Never Used  . Alcohol Use: 1.8 oz/week    3 Glasses of wine per week     Comment: per day    No Known Allergies  Current Outpatient Prescriptions  Medication Sig Dispense Refill  . estradiol (ESTRACE) 1 MG tablet Take 1 mg by mouth daily.      . lisinopril-hydrochlorothiazide (PRINZIDE,ZESTORETIC) 10-12.5 MG per tablet Take 1 tablet by mouth daily.      . ondansetron (ZOFRAN ODT) 4 MG disintegrating tablet Take 1 tablet (4 mg total) by  mouth every 8 (eight) hours as needed for nausea.  10 tablet  0  . oxyCODONE-acetaminophen (PERCOCET/ROXICET) 5-325 MG per tablet Take 1-2 tablets by mouth every 4 (four) hours as needed for pain.  29 tablet  0   No current facility-administered medications for this visit.   Facility-Administered Medications Ordered in Other Visits  Medication Dose Route Frequency Provider Last Rate Last Dose  . [COMPLETED] 0.9 %  sodium chloride infusion   Intravenous Once John-Adam Bonk, MD      . [COMPLETED] HYDROmorphone (DILAUDID) injection 0.5 mg  0.5 mg Intravenous Once John-Adam Bonk, MD   0.5 mg at 09/22/12 1105  . [COMPLETED] ketorolac (TORADOL) 15 MG/ML injection 15 mg  15 mg Intravenous Once John-Adam Bonk, MD   15 mg at 09/22/12 1234  . [COMPLETED] ondansetron (ZOFRAN) injection 4 mg  4 mg Intravenous Once John-Adam Bonk, MD   4 mg at 09/22/12 1105    Review of Systems Review of Systems  Constitutional: Negative for activity change and appetite change.  HENT: Negative for hearing loss and neck pain.   Eyes: Negative for photophobia, itching and visual disturbance.  Respiratory: Negative for chest tightness, shortness of breath, wheezing and stridor.   Cardiovascular: Negative for chest pain, palpitations and leg swelling.  Gastrointestinal:         See hpi  Genitourinary: Negative for dysuria, hematuria and difficulty urinating.  Musculoskeletal: Negative for joint swelling and arthralgias.  Skin: Negative for color change and pallor.  Neurological: Negative for tremors, seizures, weakness, light-headedness and headaches.  Hematological: Negative for adenopathy. Does not bruise/bleed easily.  Psychiatric/Behavioral: Negative for suicidal ideas. The patient is not nervous/anxious.     Blood pressure 120/76, pulse 72, temperature 97.3 F (36.3 C), temperature source Temporal, resp. rate 16, height 5' 2" (1.575 m), weight 138 lb (62.596 kg).  Physical Exam Physical Exam  Vitals  reviewed. Constitutional: She is oriented to person, place, and time. She appears well-developed and well-nourished. No distress.  HENT:  Head: Normocephalic and atraumatic.  Right Ear: External ear normal.  Eyes: Conjunctivae normal are normal. No scleral icterus.  Neck: Neck supple. No tracheal deviation present.  Cardiovascular: Normal rate, regular rhythm and normal heart sounds.   Pulmonary/Chest: Effort normal and breath sounds normal. No respiratory distress. She has no wheezes.  Abdominal: Soft. Bowel sounds are normal. She exhibits no distension. There is no tenderness. There is no rebound and no guarding.    Musculoskeletal: She exhibits no edema.  Neurological: She is alert and oriented to person, place, and time.  Skin: Skin is warm and dry. No rash noted. She is not diaphoretic. No erythema.  Psychiatric: She has a normal mood and affect. Her behavior is normal. Judgment and thought content normal.    Data Reviewed ED note and labs  COMPLETE ABDOMINAL ULTRASOUND  Comparison: No priors.  Findings:  Gallbladder: Multiple small echogenic foci within the gallbladder  demonstrating distal acoustic shadowing, compatible with small  gallstones. Gallbladder does not appear distended. Normal wall  thickness. No pericholecystic fluid. Negative Murphy's sign.  Common bile duct: Normal caliber measuring 6.1 mm in the porta  hepatis.  Liver: Normal size and echotexture without focal parenchymal  abnormality. Patent portal vein with hepatopetal flow.  IVC: Patent throughout its visualized course in the abdomen.  Pancreas: Although the pancreas is difficult to visualize in its  entirety, no focal pancreatic abnormality is identified.  Spleen: Normal size and echotexture without focal parenchymal  abnormality. 6.7 cm.  Right Kidney: No hydronephrosis. Well-preserved cortex. Normal  size and parenchymal echotexture without focal abnormalities. 9.7  cm in length.  Left Kidney: No  hydronephrosis. Well-preserved cortex. Normal  size and parenchymal echotexture without focal abnormalities. 10.6  cm in length.  Abdominal aorta: Normal caliber measuring up to 1.8 cm in diameter  and tapers appropriately distally.  IMPRESSION:  1. Cholelithiasis without findings to suggest acute cholecystitis.  2. No other acute findings in the abdomen.   Assessment    Symptomatic cholelithiasis    Plan    I believe the patient's symptoms are consistent with gallbladder disease.  We discussed gallbladder disease. The patient was given educational material. We discussed non-operative and operative management. We discussed the signs & symptoms of acute cholecystitis  I discussed laparoscopic cholecystectomy with IOC in detail.  The patient was given educational material as well as diagrams detailing the procedure.  We discussed the risks and benefits of a laparoscopic cholecystectomy including, but not limited to bleeding, infection, injury to surrounding structures such as the intestine or liver, bile leak, retained gallstones, need to convert to an open procedure, prolonged diarrhea, blood clots such as  DVT, common bile duct injury, anesthesia risks, and possible need for additional procedures.  We discussed the typical post-operative recovery course. I explained that the likelihood of improvement of their   symptoms is good.   Garrette Caine M. Maciah Feeback, MD, FACS General, Bariatric, & Minimally Invasive Surgery [facility] [place]        Wisam Siefring M 09/23/2012, 10:28 AM    

## 2012-10-15 NOTE — Anesthesia Preprocedure Evaluation (Addendum)
Anesthesia Evaluation  Patient identified by MRN, date of birth, ID band Patient awake    Reviewed: Allergy & Precautions, H&P , NPO status , Patient's Chart, lab work & pertinent test results  Airway Mallampati: II TM Distance: >3 FB Neck ROM: Full    Dental  (+) Dental Advisory Given   Pulmonary former smoker,  breath sounds clear to auscultation        Cardiovascular hypertension, Pt. on medications Rhythm:Regular Rate:Normal     Neuro/Psych    GI/Hepatic   Endo/Other    Renal/GU      Musculoskeletal   Abdominal   Peds  Hematology   Anesthesia Other Findings   Reproductive/Obstetrics                          Anesthesia Physical Anesthesia Plan  ASA: II  Anesthesia Plan: General   Post-op Pain Management:    Induction: Intravenous  Airway Management Planned: Oral ETT  Additional Equipment:   Intra-op Plan:   Post-operative Plan: Extubation in OR  Informed Consent: I have reviewed the patients History and Physical, chart, labs and discussed the procedure including the risks, benefits and alternatives for the proposed anesthesia with the patient or authorized representative who has indicated his/her understanding and acceptance.   Dental advisory given  Plan Discussed with: CRNA and Surgeon  Anesthesia Plan Comments: (Symptomatic Cholelithiasis  Plan GA  Kipp Brood, MD)       Anesthesia Quick Evaluation

## 2012-10-15 NOTE — Anesthesia Postprocedure Evaluation (Signed)
  Anesthesia Post-op Note  Patient: Carmen Ball  Procedure(s) Performed: Procedure(s) (LRB) with comments: LAPAROSCOPIC CHOLECYSTECTOMY WITH INTRAOPERATIVE CHOLANGIOGRAM (N/A)  Patient Location: PACU  Anesthesia Type:General  Level of Consciousness: awake  Airway and Oxygen Therapy: Patient Spontanous Breathing  Post-op Pain: mild  Post-op Assessment: Post-op Vital signs reviewed  Post-op Vital Signs: stable  Complications: No apparent anesthesia complications

## 2012-10-15 NOTE — Op Note (Signed)
Laparoscopic Cholecystectomy with IOC Procedure Note  Indications: This patient presents with symptomatic gallbladder disease and will undergo laparoscopic cholecystectomy.  Pre-operative Diagnosis: symptomatic cholelithiasis  Post-operative Diagnosis: Same  Surgeon: Atilano Ina   Assistants: none  Anesthesia: General endotracheal anesthesia  ASA Class: 2  Procedure Details  The patient was seen again in the Holding Room. The risks, benefits, complications, treatment options, and expected outcomes were discussed with the patient. The possibilities of reaction to medication, pulmonary aspiration, perforation of viscus, bleeding, recurrent infection, finding a normal gallbladder, the need for additional procedures, failure to diagnose a condition, the possible need to convert to an open procedure, and creating a complication requiring transfusion or operation were discussed with the patient. The likelihood of improving the patient's symptoms with return to their baseline status is good.  The patient and/or family concurred with the proposed plan, giving informed consent. The site of surgery properly noted. The patient was taken to Operating Room, identified as Carmen Ball and the procedure verified as Laparoscopic Cholecystectomy with Intraoperative Cholangiogram. A Time Out was held and the above information confirmed.  Prior to the induction of general anesthesia, antibiotic prophylaxis was administered. General endotracheal anesthesia was then administered and tolerated well. After the induction, the abdomen was prepped with Chloraprep and draped in the sterile fashion. The patient was positioned in the supine position.  Local anesthetic agent was injected into the skin near the umbilicus and an incision made. We dissected down to the abdominal fascia with blunt dissection.  The fascia was incised vertically and we entered the peritoneal cavity bluntly.  A pursestring suture of 0-Vicryl  was placed around the fascial opening.  The Hasson cannula was inserted and secured with the stay suture.  Pneumoperitoneum was then created with CO2 and tolerated well without any adverse changes in the patient's vital signs. An 5-mm port was placed in the subxiphoid position.  Two 5-mm ports were placed in the right upper quadrant. All skin incisions were infiltrated with a local anesthetic agent before making the incision and placing the trocars.   We positioned the patient in reverse Trendelenburg, tilted slightly to the patient's left.  The gallbladder was identified, the fundus grasped and retracted cephalad. Adhesions were lysed bluntly and with the electrocautery where indicated, taking care not to injure any adjacent organs or viscus. The infundibulum was grasped and retracted laterally, exposing the peritoneum overlying the triangle of Calot. This was then divided and exposed in a blunt fashion. A critical view of the cystic duct and cystic artery was obtained.  The cystic duct was clearly identified and bluntly dissected circumferentially. The cystic duct was ligated with a clip distally.   An incision was made in the cystic duct and the [facility] cholangiogram catheter introduced. The catheter was secured using a clip. A cholangiogram was then obtained which showed good visualization of the distal and proximal biliary tree with no sign of filling defects or obstruction.  Contrast flowed easily into the duodenum. The catheter was then removed.   The cystic duct was then ligated with clips and divided. The cystic artery (both an anterior and posterior branch) was identified, dissected free, and each ligated with clips and divided as well.   The gallbladder was dissected from the liver bed in retrograde fashion with the electrocautery. The gallbladder was removed and placed in an Endocatch sac.  The gallbladder and Endocatch sac were then removed through the umbilical port site. The liver bed was irrigated  and inspected. Hemostasis was  achieved with the electrocautery. Copious irrigation was utilized and was repeatedly aspirated until clear.  The pursestring suture was used to close the umbilical fascia.    We again inspected the right upper quadrant for hemostasis.  The umbilical closure was inspected and there was no air leak and nothing trapped within the closure. However there was a little give so an additional interrupted 0-Vicryl suture was placed and viewed laparoscopically.  Pneumoperitoneum was released as we removed the trocars.  4-0 Monocryl was used to close the skin.   Benzoin, steri-strips, and clean dressings were applied. The patient was then extubated and brought to the recovery room in stable condition. Instrument, sponge, and needle counts were correct at closure and at the conclusion of the case.   Findings: Chronic Cholecystitis with Cholelithiasis  Estimated Blood Loss: Minimal         Drains: none         Specimens: Gallbladder           Complications: None; patient tolerated the procedure well.         Disposition: PACU - hemodynamically stable.         Condition: stable  Carmen Ball. Andrey Campanile, MD, FACS General, Bariatric, & Minimally Invasive Surgery [facility], [place]

## 2012-10-16 ENCOUNTER — Other Ambulatory Visit (INDEPENDENT_AMBULATORY_CARE_PROVIDER_SITE_OTHER): Payer: Self-pay | Admitting: Surgery

## 2012-10-16 ENCOUNTER — Telehealth (INDEPENDENT_AMBULATORY_CARE_PROVIDER_SITE_OTHER): Payer: Self-pay | Admitting: Surgery

## 2012-10-16 MED ORDER — HYDROCODONE-ACETAMINOPHEN 5-325 MG PO TABS: 1.0000 | ORAL_TABLET | ORAL | Status: DC | PRN

## 2012-10-16 NOTE — Telephone Encounter (Signed)
Pt called stating her pharmacy could not fill the oxycodone Rx E-Rx hydrococone #40

## 2012-10-18 ENCOUNTER — Encounter (HOSPITAL_COMMUNITY): Payer: Self-pay | Admitting: General Surgery

## 2012-11-04 ENCOUNTER — Encounter (INDEPENDENT_AMBULATORY_CARE_PROVIDER_SITE_OTHER): Payer: Self-pay | Admitting: General Surgery

## 2012-11-04 ENCOUNTER — Ambulatory Visit (INDEPENDENT_AMBULATORY_CARE_PROVIDER_SITE_OTHER): Payer: BC Managed Care – PPO | Admitting: General Surgery

## 2012-11-04 VITALS — BP 122/72 | HR 70 | Resp 16 | Ht 62.0 in | Wt 136.0 lb

## 2012-11-04 DIAGNOSIS — Z09 Encounter for follow-up examination after completed treatment for conditions other than malignant neoplasm: Secondary | ICD-10-CM

## 2012-11-04 NOTE — Progress Notes (Signed)
Subjective:     Patient ID: Carmen Ball, female   DOB: 10/13/1956, 57 y.o.   MRN: [id number]  HPI 57 year old Caucasian female comes in for followup after undergoing laparoscopic cholecystectomy for gallstone disease. This was done on January 3. There was an issue with her postoperative pain prescription not being given to the patient. Other than that she did quite well. She denies any current fever, chills, nausea, vomiting, diarrhea or constipation. She denies any abdominal pain.  Review of Systems     Objective:   Physical Exam BP 122/72  Pulse 70  Resp 16  Ht 5\' 2"  (1.575 m)  Wt 136 lb (61.689 kg)  BMI 24.87 kg/m2  Gen: alert, NAD, non-toxic appearing Pupils: equal, no scleral icterus Pulm: Lungs clear to auscultation, symmetric chest rise CV: regular rate and rhythm Abd: soft, nontender, nondistended. Well-healed trocar sites. No cellulitis. No incisional hernia Ext: no edema, no calf tenderness Skin: no rash, no jaundice     Assessment:     Status post laparoscopic cholecystectomy for chronic cholecystitis and cholelithiasis.    Plan:     We reviewed her pathology report. She was given a copy of it. It showed chronic cholecystitis and cholelithiasis. I have released her to full activities. I apologize for snafu regarding her pain medication prescription. followup PRN  [name]. Andrey Campanile, MD, FACS General, Bariatric, & Minimally Invasive Surgery [facility], [place]

## 2012-11-04 NOTE — Patient Instructions (Signed)
Can resume full activities 

## 2013-01-12 ENCOUNTER — Telehealth: Payer: Self-pay | Admitting: *Deleted

## 2013-01-12 NOTE — Telephone Encounter (Signed)
Please contact patient to see if she is requesting to restart hormone therapy.

## 2013-01-12 NOTE — Telephone Encounter (Signed)
Left message on patient's cell to call back. I need to ask if she is still taking Estradiol.  bp

## 2013-02-16 ENCOUNTER — Telehealth: Payer: Self-pay | Admitting: Obstetrics and Gynecology

## 2013-02-16 ENCOUNTER — Other Ambulatory Visit: Payer: Self-pay | Admitting: Obstetrics and Gynecology

## 2013-02-16 DIAGNOSIS — N951 Menopausal and female climacteric states: Secondary | ICD-10-CM

## 2013-02-16 MED ORDER — ESTRADIOL 1 MG PO TABS: 1.0000 mg | ORAL_TABLET | Freq: Every day | ORAL | Status: DC

## 2013-02-16 NOTE — Telephone Encounter (Signed)
Please inform the patient that her Estradiol has been refilled and sent to the pharmacy.

## 2013-02-16 NOTE — Telephone Encounter (Signed)
Left message on CB# to call our office concerning her Rx estradial.

## 2013-02-16 NOTE — Telephone Encounter (Signed)
Estradial RX question. Patient having trouble getting refilled. Reports she has been trying to get the rx for "several weeks". [facility]

## 2013-02-16 NOTE — Telephone Encounter (Signed)
Patient called to request if she may go back on medication Estradiol as she was before. States she is feeling hot at times and crying a lot. Feels that she needs this medication. [facility]acy [facility]. Please advise. sue

## 2013-02-17 NOTE — Telephone Encounter (Signed)
Left message on mobile CB# of Rx for Estradiol has been [facility]. Carmen Ball

## 2014-01-23 ENCOUNTER — Other Ambulatory Visit: Payer: Self-pay | Admitting: Obstetrics and Gynecology

## 2014-01-23 DIAGNOSIS — N951 Menopausal and female climacteric states: Secondary | ICD-10-CM

## 2014-01-23 MED ORDER — ESTRADIOL 1 MG PO TABS: 1.0000 mg | ORAL_TABLET | Freq: Every day | ORAL | Status: DC

## 2014-01-23 NOTE — Telephone Encounter (Signed)
Patient notified and is aware to keep appointment for 01/25/14

## 2014-01-23 NOTE — Telephone Encounter (Signed)
Patient is requesting refill of estradiol (ESTRACE) 1 MG tablet  Take 1 tablet (1 mg total) by mouth daily., Starting 02/16/2013, Until Discontinued, Normal, Last Dose: Not Recorded  Refills: 9 ordered [id number] [place], [place] - [address]  has aex scheduled for wed 01/25/14 but needs before she is out

## 2014-01-23 NOTE — Telephone Encounter (Signed)
Needs to keep appointment this month for further refills.

## 2014-01-23 NOTE — Telephone Encounter (Signed)
Last refilled: 02/16/13 Last AEX; 11/08/12 with Dr. Edward JollySilva Last Mammogram: 03/15/12 Bi-Rads 1  Aex scheduled for 01/25/14   Chart In Your Door  Please advise.

## 2014-01-24 ENCOUNTER — Encounter: Payer: Self-pay | Admitting: Obstetrics and Gynecology

## 2014-01-25 ENCOUNTER — Encounter: Payer: Self-pay | Admitting: Obstetrics and Gynecology

## 2014-01-25 ENCOUNTER — Ambulatory Visit (INDEPENDENT_AMBULATORY_CARE_PROVIDER_SITE_OTHER): Payer: BC Managed Care – PPO | Admitting: Obstetrics and Gynecology

## 2014-01-25 VITALS — BP 100/66 | HR 68 | Resp 16 | Ht 61.0 in | Wt 142.0 lb

## 2014-01-25 DIAGNOSIS — Z0000 Encounter for general adult medical examination without abnormal findings: Secondary | ICD-10-CM

## 2014-01-25 DIAGNOSIS — Z01419 Encounter for gynecological examination (general) (routine) without abnormal findings: Secondary | ICD-10-CM

## 2014-01-25 DIAGNOSIS — N951 Menopausal and female climacteric states: Secondary | ICD-10-CM

## 2014-01-25 LAB — POCT URINALYSIS DIPSTICK
BILIRUBIN UA: NEGATIVE
Blood, UA: NEGATIVE
Glucose, UA: NEGATIVE
Ketones, UA: NEGATIVE
LEUKOCYTES UA: NEGATIVE
NITRITE UA: NEGATIVE
PH UA: 8
PROTEIN UA: NEGATIVE
UROBILINOGEN UA: NEGATIVE

## 2014-01-25 MED ORDER — ESTROGENS, CONJUGATED 0.625 MG/GM VA CREA: TOPICAL_CREAM | VAGINAL | Status: DC

## 2014-01-25 MED ORDER — ESTRADIOL 1 MG PO TABS: 1.0000 mg | ORAL_TABLET | Freq: Every day | ORAL | Status: DC

## 2014-01-25 NOTE — Progress Notes (Signed)
Patient ID: Carmen Ball, female   DOB: 1956-07-07, 58 y.o.   MRN: [id number] GYNECOLOGY VISIT  PCP:   Lavone Orn, MD  Referring provider:   HPI: 58 y.o.   Married  Caucasian  female   G2P2002 with Patient's last menstrual period was 10/24/2008.   here for  AEX.  On HRT, estradiol 1 mg.  Wants to continue due to mood swings off estrogen.   Niece breast cancer at age 33 years old.   Status post laparoscopic cholecystectomy in February 2014.   No need for refill of HSV med.   Hgb:    PCP Urine:  Neg  GYNECOLOGIC HISTORY: Patient's last menstrual period was 10/24/2008. Sexually active:  yes Partner preference: female Contraception:   Hysterectomy Menopausal hormone therapy: Estradiol 1 mg. 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  Next colonoscopy due 2017.  Cholesterol check: 2015 wnl  Family History  Problem Relation Age of Onset  . Cancer Father   . Breast cancer Other 40    BRCA neg  . Heart disease Mother   . Diabetes Mother   . Hypertension Mother   . Hypertension Sister   . Stroke Maternal Grandmother   . Hypertension Brother     There are no active problems to display for this  patient.  Past Medical History  Diagnosis Date  . Hypertension   . Abdominal pain   . Arthritis     in the knees  . 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 Osteopenia   . CIN II (cervical intraepithelial neoplasia II) 2010    --seen on pathology after hysterectomy  . STD (sexually transmitted disease) 1980    hx HSV I of buttocks    Past Surgical History  Procedure Laterality Date  . Ovarian cyst surgery  Age 28  . Appendectomy    . Abdominal hysterectomy  2010  . Cholecystectomy  10/15/2012    Procedure: LAPAROSCOPIC CHOLECYSTECTOMY WITH INTRAOPERATIVE CHOLANGIOGRAM;  Surgeon: Gayland Curry, MD,FACS;  Location: [place];  Service: General;  Laterality: N/A;  . Tubal ligation  1985  . Laparoscopic assisted vaginal hysterectomy  1/10    focal CIN II on path  ALLERGIES: Review of patient's allergies indicates no known allergies.  Current Outpatient Prescriptions  Medication Sig Dispense Refill  . estradiol (ESTRACE) 1 MG tablet Take 1 tablet (1 mg total) by mouth daily.  30 tablet  0  . lisinopril-hydrochlorothiazide (PRINZIDE,ZESTORETIC) 10-12.5 MG per tablet Take 1 tablet by mouth daily.       No current facility-administered medications for this visit.     ROS:  Pertinent items are noted in HPI.  SOCIAL HISTORY:  [employer] and exercise psychologist.  Bought a house and did a major remodeling project.   PHYSICAL EXAMINATION:    BP 100/66  Pulse 68  Resp 16  Ht '5\' 1"'  (1.549 m)  Wt 142 lb (64.411 kg)  BMI 26.84 kg/m2  LMP 10/24/2008   Wt Readings from Last 3 Encounters:  01/25/14 142 lb (64.411 kg)  11/04/12 136 lb (61.689 kg)  10/04/12 138 lb 9.6 oz (62.869 kg)     Ht Readings from Last 3 Encounters:  01/25/14 '5\' 1"'  (1.549 m)  11/04/12 '5\' 2"'  (1.575 m)  10/04/12 '5\' 1"'  (1.549 m)    General appearance: alert, cooperative and appears stated age Head: Normocephalic, without obvious abnormality, atraumatic Neck: no adenopathy, supple, symmetrical, trachea midline and thyroid not  enlarged, symmetric, no tenderness/mass/nodules Lungs: clear to auscultation bilaterally Breasts: Inspection negative, No nipple retraction or dimpling, No nipple discharge or bleeding, No axillary or supraclavicular adenopathy, Normal to palpation without dominant masses Heart: regular rate and rhythm Abdomen: soft, non-tender; no masses,  no organomegaly Extremities: extremities normal, atraumatic, no cyanosis or edema Skin: Skin color, texture, turgor normal. No rashes or lesions Lymph nodes: Cervical, supraclavicular, and axillary nodes normal. No abnormal inguinal nodes palpated Neurologic: Grossly normal  Pelvic: External genitalia:   Perineum with separation of mucosa at 6 o'clock (states she had intercourse this am.)              Urethra:  normal appearing urethra with no masses, tenderness or lesions              Bartholins and Skenes: normal                 Vagina: normal appearing vagina with normal color and discharge, no lesions              Cervix: absent              Pap and high risk HPV testing done: yes.            Bimanual Exam:  Uterus: absent                                      Adnexa:  no masses                                      Rectovaginal: Confirms                                      Anus:  normal sphincter tone, no lesions  ASSESSMENT  Normal gynecologic exam. Status post LAVH/BSO. ERT patient.  Atrophic vulvar changes with skin splitting. History of CIN II. Osteopenia.   PLAN  Mammogram recommended yearly.  Scheduled for the patient.  I will refill the Estradiol for the  patient for one month.  Rx for Premarin cream 1/2 gm per vagina at hs for 2 weeks and then 1/2 gram twice weekly.  I will refill for the remainder of the year for the Estrace oral tabs and the Premarin vaginal cream after normal mammogram is back.  Pap smear and high risk HPV testing performed.  Counseled on self breast exam, Calcium and vitamin D intake, exercise. Bone density in  2 years.  Return annually or prn   An After Visit Summary was printed and given to the patient.

## 2014-01-25 NOTE — Patient Instructions (Signed)

## 2014-01-27 LAB — IPS PAP TEST WITH HPV

## 2014-01-31 ENCOUNTER — Ambulatory Visit
Admission: RE | Admit: 2014-01-31 | Discharge: 2014-01-31 | Disposition: A | Discharge status: Home / Self Care | Payer: BC Managed Care – PPO | Source: Ambulatory Visit | Attending: Obstetrics and Gynecology | Admitting: Obstetrics and Gynecology

## 2014-01-31 ENCOUNTER — Encounter (INDEPENDENT_AMBULATORY_CARE_PROVIDER_SITE_OTHER): Payer: Self-pay

## 2014-01-31 DIAGNOSIS — Z01419 Encounter for gynecological examination (general) (routine) without abnormal findings: Secondary | ICD-10-CM

## 2014-02-02 ENCOUNTER — Other Ambulatory Visit: Payer: Self-pay | Admitting: Obstetrics and Gynecology

## 2014-02-02 DIAGNOSIS — N951 Menopausal and female climacteric states: Secondary | ICD-10-CM

## 2014-02-02 MED ORDER — ESTRADIOL 1 MG PO TABS: 1.0000 mg | ORAL_TABLET | Freq: Every day | ORAL | Status: DC

## 2014-08-14 ENCOUNTER — Encounter: Payer: Self-pay | Admitting: Obstetrics and Gynecology

## 2015-02-12 ENCOUNTER — Encounter: Payer: Self-pay | Admitting: Obstetrics and Gynecology

## 2015-02-12 ENCOUNTER — Ambulatory Visit (INDEPENDENT_AMBULATORY_CARE_PROVIDER_SITE_OTHER): Payer: BLUE CROSS/BLUE SHIELD | Admitting: Obstetrics and Gynecology

## 2015-02-12 VITALS — BP 122/86 | HR 80 | Resp 16 | Ht 61.0 in | Wt 140.0 lb

## 2015-02-12 DIAGNOSIS — Z0000 Encounter for general adult medical examination without abnormal findings: Secondary | ICD-10-CM | POA: Diagnosis not present

## 2015-02-12 DIAGNOSIS — Z01419 Encounter for gynecological examination (general) (routine) without abnormal findings: Secondary | ICD-10-CM

## 2015-02-12 DIAGNOSIS — N951 Menopausal and female climacteric states: Secondary | ICD-10-CM

## 2015-02-12 LAB — POCT URINALYSIS DIPSTICK
BILIRUBIN UA: NEGATIVE
Glucose, UA: NEGATIVE
Ketones, UA: NEGATIVE
Leukocytes, UA: NEGATIVE
Nitrite, UA: NEGATIVE
PROTEIN UA: NEGATIVE
RBC UA: NEGATIVE
Urobilinogen, UA: NEGATIVE
pH, UA: 6

## 2015-02-12 MED ORDER — VALACYCLOVIR HCL 1 G PO TABS: ORAL_TABLET | ORAL | Status: DC

## 2015-02-12 MED ORDER — ESTRADIOL 1 MG PO TABS: 1.0000 mg | ORAL_TABLET | Freq: Every day | ORAL | Status: DC

## 2015-02-12 MED ORDER — ESTRADIOL 10 MCG VA TABS: ORAL_TABLET | VAGINAL | Status: DC

## 2015-02-12 NOTE — Patient Instructions (Signed)

## 2015-02-12 NOTE — Progress Notes (Signed)
59 y.o. G30P2002 Married Caucasian female here for annual exam.    Did not use Premarin cream last year.  Questions about this. On Estrace orally.  Some external irritation off and on.   CIN II on hysterectomy pathology.   Has a history of HSV treated with Valtrex.   PCP:   Lavone Orn  Patient's last menstrual period was 10/24/2008.          Sexually active: Yes.    The current method of family planning is status post hysterectomy.    Exercising: Yes.    Treadmill Smoker:  no  Health Maintenance: Pap: 01/25/14 Neg. HR HPV:Neg History of abnormal Pap:  no MMG:  02/01/14 BIRADS1:Neg Self Breast Exam: yes, monthly Colonoscopy:  2007 Normal - Due 2017 BMD:   03/15/12  Result   TDaP: 2007 Screening Labs:  Hb today: PCP, Urine today: neg   reports that she quit smoking about 20 years ago. She has never used smokeless tobacco. She reports that she drinks about 8.4 oz of alcohol per week. She reports that she does not use illicit drugs.  Past Medical History  Diagnosis Date  . Hypertension   . Abdominal pain   . Arthritis     in the knees  . 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 Osteopenia   . CIN II (cervical intraepithelial neoplasia II) 2010    --seen on pathology after hysterectomy  . STD (sexually transmitted disease) 1980    hx HSV I of buttocks    Past Surgical History  Procedure Laterality Date  . Ovarian cyst surgery  Age 55  . Appendectomy    . Abdominal hysterectomy  2010  . Cholecystectomy  10/15/2012    Procedure: LAPAROSCOPIC CHOLECYSTECTOMY WITH INTRAOPERATIVE CHOLANGIOGRAM;  Surgeon: Gayland Curry, MD,FACS;  Location: [place];  Service: General;  Laterality: N/A;  . Tubal ligation  1985  . Laparoscopic assisted vaginal hysterectomy  1/10    focal CIN II on path    Current Outpatient Prescriptions  Medication Sig Dispense Refill  . estradiol (ESTRACE) 1 MG tablet Take 1 tablet (1 mg total) by mouth daily. 30 tablet 10  . lisinopril-hydrochlorothiazide  (PRINZIDE,ZESTORETIC) 10-12.5 MG per tablet Take 1 tablet by mouth daily.    . valACYclovir (VALTREX) 1000 MG tablet Take 1,000 mg by mouth daily as needed.    . conjugated estrogens (PREMARIN) vaginal cream Use 1/2 g vaginally every night at bed time for the first 2 weeks, then use 1/2 g vaginally two or three times per week as needed to maintain symptom relief. (Patient not taking: Reported on 02/12/2015) 30 g 0   No current facility-administered medications for this visit.    Family History  Problem Relation Age of Onset  . Cancer Father   . Breast cancer Other 40    BRCA neg  . Heart disease Mother   . Diabetes Mother   . Hypertension Mother   . Hypertension Sister   . Stroke Maternal Grandmother   . Hypertension Brother     ROS:  Pertinent items are noted in HPI.  Otherwise, a comprehensive ROS was negative.  Exam:   BP 122/86 mmHg  Pulse 80  Resp 16  Ht '5\' 1"'  (1.549 m)  Wt 140 lb (63.504 kg)  BMI 26.47 kg/m2  LMP 10/24/2008    General appearance: alert, cooperative and appears stated age Head: Normocephalic, without obvious abnormality, atraumatic Neck: no adenopathy, supple, symmetrical, trachea midline and thyroid normal to inspection and  palpation Lungs: clear to auscultation bilaterally Breasts: normal appearance, no masses or tenderness, Inspection negative, No nipple retraction or dimpling, No nipple discharge or bleeding, No axillary or supraclavicular adenopathy Heart: regular rate and rhythm Abdomen: soft, non-tender; bowel sounds normal; no masses,  no organomegaly Extremities: extremities normal, atraumatic, no cyanosis or edema Skin: Skin color, texture, turgor normal. No rashes or lesions Lymph nodes: Cervical, supraclavicular, and axillary nodes normal. No abnormal inguinal nodes palpated Neurologic: Grossly normal  Pelvic: External genitalia:  no lesions              Urethra:  normal appearing urethra with no masses, tenderness or lesions               Bartholins and Skenes: normal                 Vagina: normal appearing vagina with normal color and discharge, no lesions              Cervix: absent              Pap taken: Yes.   Bimanual Exam:  Uterus:  uterus absent              Adnexa: normal adnexa and no mass, fullness, tenderness              Rectovaginal: Yes.  .  Confirms.              Anus:  normal sphincter tone, no lesions  Chaperone was present for exam.  Assessment:   Well woman visit with normal exam. Status post TAH.  Hx of CIN II on hysterectomy pathology specimen.  Atrophic vaginal symptoms. Hx HSV I.   Plan: Yearly mammogram recommended after age 76.  Recommended self breast exam.  Pap and HR HPV as above. Discussed CIN II and HPV.  Labs performed.  No..   See orders. Refills given on medications.  Yes.  .  See orders.  Valtrex.   Estrace oral and Vagifem.  Discussed risks of DVT, PE, MI, and stroke.   Follow up annually and prn.      After visit summary provided.

## 2015-02-13 ENCOUNTER — Telehealth: Payer: Self-pay | Admitting: Obstetrics and Gynecology

## 2015-02-13 NOTE — Telephone Encounter (Signed)
She could absolutely do Estrace cream with a savings card.  Estrace 1/2 gram pv at hs for 2 weeks and then 1/2 gram pv at hs twice a week.  Please send to the pharmacy of choice.   My apologies to the patient regarding the Vagifem.  It looked like Tier I when I pulled it up on her prescription the other day!

## 2015-02-13 NOTE — Telephone Encounter (Signed)
Patient has a question regarding a prescription prescribed yesterday.

## 2015-02-13 NOTE — Telephone Encounter (Signed)
Spoke with patient. Patient states that she went to pick up rx for Estadiol vaginal tablets and it is going to cost her $180 per month. "We did a great deal of research and she said this would be less expensive but I think this is more than the most of the premarin cream. I just wanted to make sure it was not the branded medication that was sent in or something." Advised generic was sent to pharmacy. Will check with [employer] to see what medications may be on a lower tier for her and speak with Dr.Silva regarding alternatives. Patient is agreeable and verbalizes understanding.  Dr.Silva, per review of [employer] formulary these are tier 5 medications for the patient. Could patient try Estrace vaginal cream with savings card?

## 2015-02-14 MED ORDER — ESTRADIOL 0.1 MG/GM VA CREA: TOPICAL_CREAM | VAGINAL | Status: DC

## 2015-02-14 NOTE — Addendum Note (Signed)
Addended by: Ardell IsaacsAMUNDSON C SILVA, Debbe BalesBROOK E on: 02/14/2015 09:02 AM   Modules accepted: Orders

## 2015-02-14 NOTE — Telephone Encounter (Signed)
Spoke with patient. Advised patient of message as seen below form Dr.Silva. Patient states "I am really not sure that I want to use a cream. I just got the prescription filled for the tablets. I will use them for this month and then decide what would be better." Requesting rx for Estrace be sent with note to not fill unless told by the patient in case she would like to switch next month. Rx for Estrace sent to pharmacy with note to hold and with savings card information if needed. Patient is agreeable and verbalizes understanding.  Routing to provider for final review. Patient agreeable to disposition. Patient aware MD will review message and nurse will return call with any additional instructions or change of disposition. Will close encounter.

## 2015-02-16 LAB — IPS PAP TEST WITH HPV

## 2015-03-07 ENCOUNTER — Other Ambulatory Visit: Payer: Self-pay | Admitting: Obstetrics and Gynecology

## 2015-03-07 NOTE — Telephone Encounter (Signed)
Estrace tablet and vaginal cream sent to pharmacy 02/12/15 and 02/14/15. Patient notified and verified with pharmacy.

## 2015-03-07 NOTE — Telephone Encounter (Signed)
Pt states she her medication was not called into her pharmacy as discussed. She is leaving town today, late this afternoon.

## 2015-03-16 ENCOUNTER — Other Ambulatory Visit: Payer: Self-pay

## 2015-03-16 DIAGNOSIS — Z1231 Encounter for screening mammogram for malignant neoplasm of breast: Secondary | ICD-10-CM

## 2015-03-22 ENCOUNTER — Ambulatory Visit
Admission: RE | Admit: 2015-03-22 | Discharge: 2015-03-22 | Disposition: A | Discharge status: Home / Self Care | Payer: BLUE CROSS/BLUE SHIELD | Source: Ambulatory Visit

## 2015-03-22 DIAGNOSIS — Z1231 Encounter for screening mammogram for malignant neoplasm of breast: Secondary | ICD-10-CM

## 2016-01-01 ENCOUNTER — Ambulatory Visit (INDEPENDENT_AMBULATORY_CARE_PROVIDER_SITE_OTHER): Payer: BLUE CROSS/BLUE SHIELD | Admitting: Obstetrics and Gynecology

## 2016-01-01 ENCOUNTER — Telehealth: Payer: Self-pay | Admitting: Obstetrics and Gynecology

## 2016-01-01 ENCOUNTER — Encounter: Payer: Self-pay | Admitting: Obstetrics and Gynecology

## 2016-01-01 VITALS — Ht 61.0 in

## 2016-01-01 DIAGNOSIS — R21 Rash and other nonspecific skin eruption: Secondary | ICD-10-CM | POA: Diagnosis not present

## 2016-01-01 DIAGNOSIS — N644 Mastodynia: Secondary | ICD-10-CM | POA: Diagnosis not present

## 2016-01-01 NOTE — Progress Notes (Signed)
Patient ID: Carmen Ball, female   DOB: July 07, 1956, 60 y.o.   MRN: [id number] GYNECOLOGY  VISIT   HPI: 60 y.o.   Married  Caucasian  female   G2P2002 with Patient's last menstrual period was 10/24/2008.   here for left breast pain with bruising which began 4 days ago.   It is a mild discomfort, ache, pulling sensation for the last 4 days. She really only notices the discomfort when she stretches or reaches for something. Also noticed redness under her left axilla, no pruritus or pain in that area. No new exercise, no trauma. She has a h/o a hysterectomy and in on estradiol. Last mammogram was in [place].  GYNECOLOGIC HISTORY: Patient's last menstrual period was 10/24/2008. Contraception:Hysterectomy Menopausal hormone therapy: Estradiol 35m daily, Estrace vaginal cream 1/2 gm pv 3x/week        OB History    Gravida Para Term Preterm AB TAB SAB Ectopic Multiple Living   '2 2 2       2         ' There are no active problems to display for this patient.   Past Medical History  Diagnosis Date  . Hypertension   . Abdominal pain   . Arthritis     in the knees  . 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 Osteopenia   . CIN II (cervical intraepithelial neoplasia II) 2010    --seen on pathology after hysterectomy  . STD (sexually transmitted disease) 1980    hx HSV I of buttocks    Past Surgical History  Procedure Laterality Date  . Ovarian cyst surgery  Age 60 . Appendectomy    . Abdominal hysterectomy  2010  . Cholecystectomy  10/15/2012    Procedure: LAPAROSCOPIC CHOLECYSTECTOMY WITH INTRAOPERATIVE CHOLANGIOGRAM;  Surgeon: EGayland Curry MD,FACS;  Location: [facility]  Service: General;  Laterality: N/A;  . Tubal ligation  1985  . Laparoscopic assisted vaginal hysterectomy  1/10    focal CIN II on path    Current Outpatient Prescriptions  Medication Sig Dispense Refill  . estradiol (ESTRACE VAGINAL) 0.1 MG/GM vaginal cream Place 1/2 gram pv every night for 2 weeks. Then 1/2 gram pv every  night twice a week. 42.5 g 12  . estradiol (ESTRACE) 1 MG tablet Take 1 tablet (1 mg total) by mouth daily. 30 tablet 11  . lisinopril-hydrochlorothiazide (PRINZIDE,ZESTORETIC) 10-12.5 MG per tablet Take 1 tablet by mouth daily.    . valACYclovir (VALTREX) 1000 MG tablet Take one tablet (1000 mg)  by mouth twice a day for 3 days. 30 tablet 2  . Estradiol 10 MCG TABS vaginal tablet Place one tablet (10 mcg) Per vagina at bedtime for 2 weeks and then place in the vagina twice weekly. (Patient not taking: Reported on 01/01/2016) 18 tablet 11   No current facility-administered medications for this visit.     ALLERGIES: Review of patient's allergies indicates no known allergies.  Family History  Problem Relation Age of Onset  . Cancer Father   . Breast cancer Other 40    BRCA neg  . Heart disease Mother   . Diabetes Mother   . Hypertension Mother   . Hypertension Sister   . Stroke Maternal Grandmother   . Hypertension Brother     Social History   Social History  . Marital Status: Married    Spouse Name: N/A  . Number of Children: N/A  . Years of Education: N/A   Occupational History  .  Not on file.   Social History Main Topics  . Smoking status: Former Smoker    Quit date: 09/23/1994  . Smokeless tobacco: Never Used  . Alcohol Use: 8.4 oz/week    14 Glasses of wine per week     Comment: 2 glasses of wine per day  . Drug Use: No  . Sexual Activity:    Partners: Female    Patent examiner Protection: Surgical     Comment: LAVH/BSO   Other Topics Concern  . Not on file   Social History Narrative    ROS  PHYSICAL EXAMINATION:    Ht '5\' 1"'  (1.549 m)  LMP 10/24/2008    General appearance: alert, cooperative and appears stated age Breasts: normal appearance, no masses or tenderness  No axillary or supraclavicular adenopathy She has erythema in her left axilla, no vesicles, no papules, no scaling  ASSESSMENT Left mastalgia, given her symptoms, suspect MS strain Rash  under her left axilla, not itchy or painful    PLAN Discussed using NSAID's as needed, heat if needed Cut back on caffeine If breast pain persists next week, will get diagnostic imaging of the left breast  Can try Vaseline in her left axilla, call with change in symptoms   An After Visit Summary was printed and given to the patient.  ______ minutes face to face time of which over 50% was spent in counseling.

## 2016-01-04 ENCOUNTER — Ambulatory Visit (INDEPENDENT_AMBULATORY_CARE_PROVIDER_SITE_OTHER): Payer: BLUE CROSS/BLUE SHIELD | Admitting: Obstetrics and Gynecology

## 2016-01-04 ENCOUNTER — Encounter: Payer: Self-pay | Admitting: Obstetrics and Gynecology

## 2016-01-04 VITALS — BP 122/84 | HR 68 | Ht 61.0 in | Wt 143.0 lb

## 2016-01-04 DIAGNOSIS — N644 Mastodynia: Secondary | ICD-10-CM | POA: Diagnosis not present

## 2016-01-04 NOTE — Progress Notes (Signed)
Patient ID: Carmen Ball, female   DOB: 05-06-1956, 60 y.o.   MRN: [id number]  GYNECOLOGY  VISIT   HPI: 60 y.o.   Married  Caucasian  female   G2P2002 with Patient's last menstrual period was 10/24/2008.   here for evaluation of left breast pain that is worsening.  Feels like it is musculoskeletal in nature.  Hurts behind the nipple and pulls when she raises her arm. Seen by Dr. Talbert Nan and was noted to have erythema of skin in the left axilla.   Does not radiate down her left arm or up into the jam. No crushing chest pain.  No nausea.  No cough.  Denies trauma.  Has not taken any pain medication.   Taking oral Etrace and vaginal Estrace cream.   Nonpainful rash under her left arm.   Did get the shingles vaccine.   GYNECOLOGIC HISTORY: Patient's last menstrual period was 10/24/2008. Contraception:hsyterectomy Menopausal hormone therapy: estradiol tab and cream Last mammogram: 03/22/15, Bi-Rads 1:  Negative Last pap smear: 01/25/14, negative with neg HR HPV        OB History    Gravida Para Term Preterm AB TAB SAB Ectopic Multiple Living   _0 There are no active problems to display for this patient.   Past Medical History  Diagnosis Date  . Hypertension   . Abdominal pain   . Arthritis     in the knees  . 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 Osteopenia   . CIN II (cervical intraepithelial neoplasia II) 2010    --seen on pathology after hysterectomy  . STD (sexually transmitted disease) 1980    hx HSV I of buttocks    Past Surgical History  Procedure Laterality Date  . Ovarian cyst surgery  Age 86  . Appendectomy    . Abdominal hysterectomy  2010  . Cholecystectomy  10/15/2012    Procedure: LAPAROSCOPIC CHOLECYSTECTOMY WITH INTRAOPERATIVE CHOLANGIOGRAM;  Surgeon: Gayland Curry, MD,FACS;  Location: [place] [facility];  Service: General;  Laterality: N/A;  . Tubal ligation  1985  . Laparoscopic assisted vaginal hysterectomy  1/10    focal CIN II on path     Current Outpatient Prescriptions  Medication Sig Dispense Refill  . estradiol (ESTRACE VAGINAL) 0.1 MG/GM vaginal cream Place 1/2 gram pv every night for 2 weeks. Then 1/2 gram pv every night twice a week. 42.5 g 12  . estradiol (ESTRACE) 1 MG tablet Take 1 tablet (1 mg total) by mouth daily. 30 tablet 11  . Estradiol 10 MCG TABS vaginal tablet Place one tablet (10 mcg) Per vagina at bedtime for 2 weeks and then place in the vagina twice weekly. 18 tablet 11  . lisinopril-hydrochlorothiazide (PRINZIDE,ZESTORETIC) 10-12.5 MG per tablet Take 1 tablet by mouth daily.    . valACYclovir (VALTREX) 1000 MG tablet Take one tablet (1000 mg)  by mouth twice a day for 3 days. 30 tablet 2   No current facility-administered medications for this visit.     ALLERGIES: Review of patient's allergies indicates no known allergies.  Family History  Problem Relation Age of Onset  . Cancer Father   . Breast cancer Other 40    BRCA neg  . Heart disease Mother   . Diabetes Mother   . Hypertension Mother   . Hypertension Sister   . Stroke Maternal Grandmother   . Hypertension Brother  Social History   Social History  . Marital Status: Married    Spouse Name: N/A  . Number of Children: N/A  . Years of Education: N/A   Occupational History  . Not on file.   Social History Main Topics  . Smoking status: Former Smoker    Quit date: 09/23/1994  . Smokeless tobacco: Never Used  . Alcohol Use: 8.4 oz/week    14 Glasses of wine per week     Comment: 2 glasses of wine per day  . Drug Use: No  . Sexual Activity:    Partners: Female    Patent examiner Protection: Surgical     Comment: LAVH/BSO   Other Topics Concern  . Not on file   Social History Narrative    ROS:  Pertinent items are noted in HPI.  PHYSICAL EXAMINATION:    BP 122/84 mmHg  Pulse 68  Ht _0  (1.549 m)  Wt 143 lb (64.864 kg)  BMI 27.03 kg/m2  LMP 10/24/2008    General appearance: alert, cooperative and appears  stated age   Breasts: normal appearance, no masses or tenderness, Inspection negative, No nipple retraction or dimpling, No nipple discharge or bleeding, No axillary or supraclavicular adenopathy, left breast with fibrocystic change at 2:00.  No nodes retractions, nipple discharge or axillary adenopathy.  6 x 3 cm nonraised erythema of left axilla.  No ulcers or vesicles. Nontender.   Chaperone was present for exam.  ASSESSMENT  Left breast pain.   PLAN  Counseled regarding breast and chest wall pain.  Left diagnostic mammogram and left breast ultrasound.  Ibuprofen 800 mg po q 8 hours prn.  Discussed discontinuation or weaning of Estrace oral tabs.  Discussed risk of DVT, PE, stroke, and possible breast cancer and MI.  If pain persists or develops worsening pain, to ER for cardiac eval.   An After Visit Summary was printed and given to the patient.  ____15__ minutes face to face time of which over 50% was spent in counseling.

## 2016-01-04 NOTE — Progress Notes (Signed)
Diagnostic left breast mammogram and ultrasound scheduled for 01-11-16 at 0800 at [facility]. Patient agreeable to appointment date and time.

## 2016-01-07 NOTE — Telephone Encounter (Signed)
Message left to return call to [facility] at [phone].   Patient is [facility] 01/11/16.  L Breast Pain and diagnostic imaging scheduled at office visit.  Last screening mammogram 03/22/15

## 2016-01-07 NOTE — Telephone Encounter (Signed)
Patient called and said, "I need to cancel my upcoming appointments for breast imaging. I am not having breast pain any longer. I guess I need some assistance with this since the nurse helped me schedule the appointment."

## 2016-01-08 NOTE — Telephone Encounter (Signed)
Ok to cancel diagnostic mammogram and ultrasound and removed from mammogram hold. No specific mass was palpable on exam.  I am glad the pain resolved!

## 2016-01-08 NOTE — Telephone Encounter (Signed)
Patient returned call. She states that she is no longer having any L Breast Pain and states "I feel like additional testing is not warranted at this time." She states that her pain stopped on Saturday and she has not had any pain since. No swelling in axillary area. Advised that I can assist with cancelling diagnostic imaging, however, will send message to Dr. Edward JollySilva for review. Patient agreeable. She is advised return call if breast pain returns or develops any concerns. She is agreeable.

## 2016-01-09 NOTE — Telephone Encounter (Signed)
Out of hold per Dr. Edward JollySilva.  Appointments cancelled. Orders DC.

## 2016-01-11 ENCOUNTER — Other Ambulatory Visit: Payer: BLUE CROSS/BLUE SHIELD

## 2016-02-14 ENCOUNTER — Ambulatory Visit: Payer: BLUE CROSS/BLUE SHIELD | Admitting: Obstetrics and Gynecology

## 2016-03-04 ENCOUNTER — Other Ambulatory Visit: Payer: Self-pay | Admitting: Obstetrics and Gynecology

## 2016-03-04 NOTE — Telephone Encounter (Signed)
Medication refill request: Estradiol  Last AEX:  02-12-15 Next AEX: 03-24-16 Last MMG (if hormonal medication request): 03-22-15 WNL Refill authorized: please advise

## 2016-03-24 ENCOUNTER — Ambulatory Visit (INDEPENDENT_AMBULATORY_CARE_PROVIDER_SITE_OTHER): Payer: BLUE CROSS/BLUE SHIELD | Admitting: Obstetrics and Gynecology

## 2016-03-24 ENCOUNTER — Encounter: Payer: Self-pay | Admitting: Obstetrics and Gynecology

## 2016-03-24 VITALS — BP 124/82 | HR 66 | Resp 14 | Ht 60.5 in | Wt 142.4 lb

## 2016-03-24 DIAGNOSIS — Z1272 Encounter for screening for malignant neoplasm of vagina: Secondary | ICD-10-CM | POA: Diagnosis not present

## 2016-03-24 DIAGNOSIS — Z1151 Encounter for screening for human papillomavirus (HPV): Secondary | ICD-10-CM | POA: Diagnosis not present

## 2016-03-24 DIAGNOSIS — Z79899 Other long term (current) drug therapy: Secondary | ICD-10-CM | POA: Diagnosis not present

## 2016-03-24 DIAGNOSIS — Z01419 Encounter for gynecological examination (general) (routine) without abnormal findings: Secondary | ICD-10-CM

## 2016-03-24 MED ORDER — ESTRADIOL 10 MCG VA TABS: ORAL_TABLET | VAGINAL | Status: DC

## 2016-03-24 MED ORDER — VALACYCLOVIR HCL 1 G PO TABS: ORAL_TABLET | ORAL | Status: DC

## 2016-03-24 MED ORDER — ESTRADIOL 0.5 MG PO TABS: 0.5000 mg | ORAL_TABLET | Freq: Every day | ORAL | Status: DC

## 2016-03-24 NOTE — Progress Notes (Signed)
Patient ID: Carmen Ball, female   DOB: October 24, 1955, 60 y.o.   MRN: [id number] 60 y.o. G40P2002 Married Caucasian female here for annual exam.    Using oral and local estrogen.   Trouble sleeping.   Breast pain resolved.  PCP: Lavone Orn, MD  Patient's last menstrual period was 10/24/2008.           Sexually active: No.  The current method of family planning is status post Tubal/ LAVH/BSO.    Exercising: Yes.    Treadmill.3x/week for 30-40 minutes Smoker:  no  Health Maintenance: Pap:  02/12/15 - ASCUS:Neg HR HPV History of abnormal Pap: 2010 CIN II--see path MMG:  03-22-15 Density A/Neg/BiRads1:[facility] Colonoscopy:  2007 normal with [facility];due this year. BMD:  03-15-12  Result  Normal with PCP TDaP:  2007 with PCP.  Thinks it was more recently.  Zostavax:  Will do with PCP. Gardasil:   N/A HIV:  Will do for insurance. Hep C: with PCP.  Screening Labs:  Hb today: PCP, Urine today: not done   reports that she quit smoking about 21 years ago. She has never used smokeless tobacco. She reports that she drinks about 8.4 oz of alcohol per week. She reports that she uses illicit drugs (Marijuana).  Past Medical History  Diagnosis Date  . Hypertension   . Abdominal pain   . Arthritis     in the knees  . 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 Osteopenia   . CIN II (cervical intraepithelial neoplasia II) 2010    --seen on pathology after hysterectomy  . STD (sexually transmitted disease) 1980    hx HSV I of buttocks    Past Surgical History  Procedure Laterality Date  . Ovarian cyst surgery  Age 62  . Appendectomy    . Abdominal hysterectomy  2010  . Cholecystectomy  10/15/2012    Procedure: LAPAROSCOPIC CHOLECYSTECTOMY WITH INTRAOPERATIVE CHOLANGIOGRAM;  Surgeon: Gayland Curry, MD,FACS;  Location: [place];  Service: General;  Laterality: N/A;  . Tubal ligation  1985  . Laparoscopic assisted vaginal hysterectomy  1/10    focal CIN II on path    Current Outpatient  Prescriptions  Medication Sig Dispense Refill  . estradiol (ESTRACE) 1 MG tablet TAKE 1 TABLET (1 MG TOTAL) BY MOUTH DAILY. 30 tablet 0  . Estradiol 10 MCG TABS vaginal tablet Place one tablet (10 mcg) Per vagina at bedtime for 2 weeks and then place in the vagina twice weekly. 18 tablet 11  . lisinopril-hydrochlorothiazide (PRINZIDE,ZESTORETIC) 10-12.5 MG per tablet Take 1 tablet by mouth daily.    . valACYclovir (VALTREX) 1000 MG tablet Take one tablet (1000 mg)  by mouth twice a day for 3 days. 30 tablet 2   No current facility-administered medications for this visit.    Family History  Problem Relation Age of Onset  . Cancer Father   . Breast cancer Other 40    BRCA neg  . Heart disease Mother   . Diabetes Mother   . Hypertension Mother   . Hypertension Sister   . Stroke Maternal Grandmother   . Hypertension Brother     ROS:  Pertinent items are noted in HPI.  Otherwise, a comprehensive ROS was negative.  Exam:   BP 124/82 mmHg  Pulse 66  Resp 14  Ht 5' 0.5" (1.537 m)  Wt 142 lb 6.4 oz (64.592 kg)  BMI 27.34 kg/m2  LMP 10/24/2008    General appearance: alert, cooperative and  appears stated age Head: Normocephalic, without obvious abnormality, atraumatic Neck: no adenopathy, supple, symmetrical, trachea midline and thyroid normal to inspection and palpation Lungs: clear to auscultation bilaterally Breasts: normal appearance, no masses or tenderness, Inspection negative, No nipple retraction or dimpling.  No axillary adenopathy.  Heart: regular rate and rhythm Abdomen:   soft, non-tender; no masses, no organomegaly Extremities: extremities normal, atraumatic, no cyanosis or edema Skin: Skin color, texture, turgor normal. No rashes or lesions Lymph nodes: Cervical, supraclavicular, and axillary nodes normal. No abnormal inguinal nodes palpated Neurologic: Grossly normal  Pelvic: External genitalia:  no lesions              Urethra:  normal appearing urethra with no  masses, tenderness or lesions              Bartholins and Skenes: normal                 Vagina: normal appearing vagina with normal color and discharge, no lesions              Cervix: absent and vaginal estrogen cream wiped away.               Pap taken: Yes.   Bimanual Exam:  Uterus:  uterus absent              Adnexa: no mass, fullness, tenderness              Rectal exam: Yes.  .  Confirms.              Anus:  normal sphincter tone, no lesions  Chaperone was present for exam.  Assessment:   Well woman visit with normal exam. Status post TAH.  Hx of CIN II on hysterectomy pathology specimen 2010. Atrophic vaginal symptoms. Hx HSV I.   Plan: Yearly mammogram recommended after age 53.  Patient will schedule.  Recommended self breast exam.  Pap and HR HPV as above. Reviewed Calcium, Vitamin D, regular exercise program including cardiovascular and weight bearing exercise. Labs performed.  No..   See orders. Prescription medication(s) given.  Yes.  .  See orders.  Estradiol 0.5 mg daily and Vagifem.  I discussed risks which include DVT, PE, stroke and possible MI and breast cancer.  Refill also for Valtrex.  Follow up annually and prn.      After visit summary provided.

## 2016-03-24 NOTE — Patient Instructions (Addendum)
Health Maintenance, Female Adopting a healthy lifestyle and getting preventive care can go a long way to promote health and wellness. Talk with your health care provider about what schedule of regular examinations is right for you. This is a good chance for you to check in with your provider about disease prevention and staying healthy. In between checkups, there are plenty of things you can do on your own. Experts have done a lot of research about which lifestyle changes and preventive measures are most likely to keep you healthy. Ask your health care provider for more information. WEIGHT AND DIET  Eat a healthy diet  Be sure to include plenty of vegetables, fruits, low-fat dairy products, and lean protein.  Do not eat a lot of foods high in solid fats, added sugars, or salt.  Get regular exercise. This is one of the most important things you can do for your health.  Most adults should exercise for at least 150 minutes each week. The exercise should increase your heart rate and make you sweat (moderate-intensity exercise).  Most adults should also do strengthening exercises at least twice a week. This is in addition to the moderate-intensity exercise.  Maintain a healthy weight  Body mass index (BMI) is a measurement that can be used to identify possible weight problems. It estimates body fat based on height and weight. Your health care provider can help determine your BMI and help you achieve or maintain a healthy weight.  For females 20 years of age and older:   A BMI below 18.5 is considered underweight.  A BMI of 18.5 to 24.9 is normal.  A BMI of 25 to 29.9 is considered overweight.  A BMI of 30 and above is considered obese.  Watch levels of cholesterol and blood lipids  You should start having your blood tested for lipids and cholesterol at 60 years of age, then have this test every 5 years.  You may need to have your cholesterol levels checked more often if:  Your lipid  or cholesterol levels are high.  You are older than 60 years of age.  You are at high risk for heart disease.  CANCER SCREENING   Lung Cancer  Lung cancer screening is recommended for adults 55-80 years old who are at high risk for lung cancer because of a history of smoking.  A yearly low-dose CT scan of the lungs is recommended for people who:  Currently smoke.  Have quit within the past 15 years.  Have at least a 30-pack-year history of smoking. A pack year is smoking an average of one pack of cigarettes a day for 1 year.  Yearly screening should continue until it has been 15 years since you quit.  Yearly screening should stop if you develop a health problem that would prevent you from having lung cancer treatment.  Breast Cancer  Practice breast self-awareness. This means understanding how your breasts normally appear and feel.  It also means doing regular breast self-exams. Let your health care provider know about any changes, no matter how small.  If you are in your 20s or 30s, you should have a clinical breast exam (CBE) by a health care provider every 1-3 years as part of a regular health exam.  If you are 40 or older, have a CBE every year. Also consider having a breast X-ray (mammogram) every year.  If you have a family history of breast cancer, talk to your health care provider about genetic screening.  If you   are at high risk for breast cancer, talk to your health care provider about having an MRI and a mammogram every year.  Breast cancer gene (BRCA) assessment is recommended for women who have family members with BRCA-related cancers. BRCA-related cancers include:  Breast.  Ovarian.  Tubal.  Peritoneal cancers.  Results of the assessment will determine the need for genetic counseling and BRCA1 and BRCA2 testing. Cervical Cancer Your health care provider may recommend that you be screened regularly for cancer of the pelvic organs (ovaries, uterus, and  vagina). This screening involves a pelvic examination, including checking for microscopic changes to the surface of your cervix (Pap test). You may be encouraged to have this screening done every 3 years, beginning at age 21.  For women ages 30-65, health care providers may recommend pelvic exams and Pap testing every 3 years, or they may recommend the Pap and pelvic exam, combined with testing for human papilloma virus (HPV), every 5 years. Some types of HPV increase your risk of cervical cancer. Testing for HPV may also be done on women of any age with unclear Pap test results.  Other health care providers may not recommend any screening for nonpregnant women who are considered low risk for pelvic cancer and who do not have symptoms. Ask your health care provider if a screening pelvic exam is right for you.  If you have had past treatment for cervical cancer or a condition that could lead to cancer, you need Pap tests and screening for cancer for at least 20 years after your treatment. If Pap tests have been discontinued, your risk factors (such as having a new sexual partner) need to be reassessed to determine if screening should resume. Some women have medical problems that increase the chance of getting cervical cancer. In these cases, your health care provider may recommend more frequent screening and Pap tests. Colorectal Cancer  This type of cancer can be detected and often prevented.  Routine colorectal cancer screening usually begins at 60 years of age and continues through 60 years of age.  Your health care provider may recommend screening at an earlier age if you have risk factors for colon cancer.  Your health care provider may also recommend using home test kits to check for hidden blood in the stool.  A small camera at the end of a tube can be used to examine your colon directly (sigmoidoscopy or colonoscopy). This is done to check for the earliest forms of colorectal  cancer.  Routine screening usually begins at age 50.  Direct examination of the colon should be repeated every 5-10 years through 60 years of age. However, you may need to be screened more often if early forms of precancerous polyps or small growths are found. Skin Cancer  Check your skin from head to toe regularly.  Tell your health care provider about any new moles or changes in moles, especially if there is a change in a mole's shape or color.  Also tell your health care provider if you have a mole that is larger than the size of a pencil eraser.  Always use sunscreen. Apply sunscreen liberally and repeatedly throughout the day.  Protect yourself by wearing long sleeves, pants, a wide-brimmed hat, and sunglasses whenever you are outside. HEART DISEASE, DIABETES, AND HIGH BLOOD PRESSURE   High blood pressure causes heart disease and increases the risk of stroke. High blood pressure is more likely to develop in:  People who have blood pressure in the high end   of the normal range (130-139/85-89 mm Hg).  People who are overweight or obese.  People who are African American.  If you are 38-23 years of age, have your blood pressure checked every 3-5 years. If you are 61 years of age or older, have your blood pressure checked every year. You should have your blood pressure measured twice--once when you are at a hospital or clinic, and once when you are not at a hospital or clinic. Record the average of the two measurements. To check your blood pressure when you are not at a hospital or clinic, you can use:  An automated blood pressure machine at a pharmacy.  A home blood pressure monitor.  If you are between 45 years and 39 years old, ask your health care provider if you should take aspirin to prevent strokes.  Have regular diabetes screenings. This involves taking a blood sample to check your fasting blood sugar level.  If you are at a normal weight and have a low risk for diabetes,  have this test once every three years after 60 years of age.  If you are overweight and have a high risk for diabetes, consider being tested at a younger age or more often. PREVENTING INFECTION  Hepatitis B  If you have a higher risk for hepatitis B, you should be screened for this virus. You are considered at high risk for hepatitis B if:  You were born in a country where hepatitis B is common. Ask your health care provider which countries are considered high risk.  Your parents were born in a high-risk country, and you have not been immunized against hepatitis B (hepatitis B vaccine).  You have HIV or AIDS.  You use needles to inject street drugs.  You live with someone who has hepatitis B.  You have had sex with someone who has hepatitis B.  You get hemodialysis treatment.  You take certain medicines for conditions, including cancer, organ transplantation, and autoimmune conditions. Hepatitis C  Blood testing is recommended for:  Everyone born from [birth year] through 1965.  Anyone with known risk factors for hepatitis C. Sexually transmitted infections (STIs)  You should be screened for sexually transmitted infections (STIs) including gonorrhea and chlamydia if:  You are sexually active and are younger than 60 years of age.  You are older than 60 years of age and your health care provider tells you that you are at risk for this type of infection.  Your sexual activity has changed since you were last screened and you are at an increased risk for chlamydia or gonorrhea. Ask your health care provider if you are at risk.  If you do not have HIV, but are at risk, it may be recommended that you take a prescription medicine daily to prevent HIV infection. This is called pre-exposure prophylaxis (PrEP). You are considered at risk if:  You are sexually active and do not regularly use condoms or know the HIV status of your partner(s).  You take drugs by injection.  You are sexually  active with a partner who has HIV. Talk with your health care provider about whether you are at high risk of being infected with HIV. If you choose to begin PrEP, you should first be tested for HIV. You should then be tested every 3 months for as long as you are taking PrEP.  PREGNANCY   If you are premenopausal and you may become pregnant, ask your health care provider about preconception counseling.  If you may  become pregnant, take 400 to 800 micrograms (mcg) of folic acid every day.  If you want to prevent pregnancy, talk to your health care provider about birth control (contraception). OSTEOPOROSIS AND MENOPAUSE   Osteoporosis is a disease in which the bones lose minerals and strength with aging. This can result in serious bone fractures. Your risk for osteoporosis can be identified using a bone density scan.  If you are 37 years of age or older, or if you are at risk for osteoporosis and fractures, ask your health care provider if you should be screened.  Ask your health care provider whether you should take a calcium or vitamin D supplement to lower your risk for osteoporosis.  Menopause may have certain physical symptoms and risks.  Hormone replacement therapy may reduce some of these symptoms and risks. Talk to your health care provider about whether hormone replacement therapy is right for you.  HOME CARE INSTRUCTIONS   Schedule regular health, dental, and eye exams.  Stay current with your immunizations.   Do not use any tobacco products including cigarettes, chewing tobacco, or electronic cigarettes.  If you are pregnant, do not drink alcohol.  If you are breastfeeding, limit how much and how often you drink alcohol.  Limit alcohol intake to no more than 1 drink per day for nonpregnant women. One drink equals 12 ounces of beer, 5 ounces of wine, or 1 ounces of hard liquor.  Do not use street drugs.  Do not share needles.  Ask your health care provider for help if  you need support or information about quitting drugs.  Tell your health care provider if you often feel depressed.  Tell your health care provider if you have ever been abused or do not feel safe at home.   This information is not intended to replace advice given to you by your health care provider. Make sure you discuss any questions you have with your health care provider.   Document Released: 04/14/2011 Document Revised: 10/20/2014 Document Reviewed: 08/31/2013 Elsevier Interactive Patient Education [employer].   Other options for estrogen therapy are:  Estring - for local vaginal estrogen dryness.  This would need to be used with the Estradiol tablets you currently take.   Femring - this is a vaginal estrogen ring that does full estrogen therapy and local vaginal estrogen treatment.

## 2016-03-26 NOTE — Addendum Note (Signed)
Addended by: Ardell IsaacsAMUNDSON C SILVA, BROOK E on: 03/26/2016 12:52 PM   Modules accepted: Orders

## 2016-03-28 LAB — IPS PAP TEST WITH HPV

## 2016-04-12 ENCOUNTER — Other Ambulatory Visit: Payer: Self-pay | Admitting: Obstetrics and Gynecology

## 2016-05-02 ENCOUNTER — Other Ambulatory Visit: Payer: Self-pay | Admitting: Obstetrics and Gynecology

## 2016-05-02 DIAGNOSIS — Z1231 Encounter for screening mammogram for malignant neoplasm of breast: Secondary | ICD-10-CM

## 2016-05-14 ENCOUNTER — Ambulatory Visit
Admission: RE | Admit: 2016-05-14 | Discharge: 2016-05-14 | Disposition: A | Discharge status: Home / Self Care | Payer: BLUE CROSS/BLUE SHIELD | Source: Ambulatory Visit | Attending: Obstetrics and Gynecology | Admitting: Obstetrics and Gynecology

## 2016-05-14 DIAGNOSIS — Z1231 Encounter for screening mammogram for malignant neoplasm of breast: Secondary | ICD-10-CM | POA: Diagnosis not present

## 2016-05-15 ENCOUNTER — Other Ambulatory Visit: Payer: Self-pay | Admitting: Obstetrics and Gynecology

## 2016-06-06 DIAGNOSIS — I10 Essential (primary) hypertension: Secondary | ICD-10-CM | POA: Diagnosis not present

## 2016-07-10 DIAGNOSIS — H5203 Hypermetropia, bilateral: Secondary | ICD-10-CM | POA: Diagnosis not present

## 2016-07-10 DIAGNOSIS — H52221 Regular astigmatism, right eye: Secondary | ICD-10-CM | POA: Diagnosis not present

## 2016-12-08 DIAGNOSIS — Z0000 Encounter for general adult medical examination without abnormal findings: Secondary | ICD-10-CM | POA: Diagnosis not present

## 2016-12-08 DIAGNOSIS — I10 Essential (primary) hypertension: Secondary | ICD-10-CM | POA: Diagnosis not present

## 2017-04-21 DIAGNOSIS — Z01818 Encounter for other preprocedural examination: Secondary | ICD-10-CM | POA: Diagnosis not present

## 2017-04-21 DIAGNOSIS — Z1211 Encounter for screening for malignant neoplasm of colon: Secondary | ICD-10-CM | POA: Diagnosis not present

## 2017-05-12 ENCOUNTER — Other Ambulatory Visit: Payer: Self-pay | Admitting: Obstetrics and Gynecology

## 2017-05-12 DIAGNOSIS — Z1231 Encounter for screening mammogram for malignant neoplasm of breast: Secondary | ICD-10-CM

## 2017-05-26 ENCOUNTER — Ambulatory Visit
Admission: RE | Admit: 2017-05-26 | Discharge: 2017-05-26 | Disposition: A | Discharge status: Home / Self Care | Payer: BLUE CROSS/BLUE SHIELD | Source: Ambulatory Visit | Attending: Obstetrics and Gynecology | Admitting: Obstetrics and Gynecology

## 2017-05-26 DIAGNOSIS — Z1231 Encounter for screening mammogram for malignant neoplasm of breast: Secondary | ICD-10-CM

## 2017-06-01 DIAGNOSIS — Z23 Encounter for immunization: Secondary | ICD-10-CM | POA: Diagnosis not present

## 2017-06-04 DIAGNOSIS — K635 Polyp of colon: Secondary | ICD-10-CM | POA: Diagnosis not present

## 2017-06-04 DIAGNOSIS — D126 Benign neoplasm of colon, unspecified: Secondary | ICD-10-CM | POA: Diagnosis not present

## 2017-06-04 DIAGNOSIS — K648 Other hemorrhoids: Secondary | ICD-10-CM | POA: Diagnosis not present

## 2017-06-04 DIAGNOSIS — Z1211 Encounter for screening for malignant neoplasm of colon: Secondary | ICD-10-CM | POA: Diagnosis not present

## 2017-06-04 DIAGNOSIS — K621 Rectal polyp: Secondary | ICD-10-CM | POA: Diagnosis not present

## 2017-06-08 DIAGNOSIS — D126 Benign neoplasm of colon, unspecified: Secondary | ICD-10-CM | POA: Diagnosis not present

## 2017-06-08 DIAGNOSIS — Z1211 Encounter for screening for malignant neoplasm of colon: Secondary | ICD-10-CM | POA: Diagnosis not present

## 2017-06-08 DIAGNOSIS — K635 Polyp of colon: Secondary | ICD-10-CM | POA: Diagnosis not present

## 2017-06-08 NOTE — Progress Notes (Signed)
61 y.o. G29P2002 Married Caucasian female here for annual exam.    Wants to continue estrogen until she returns from [place].  Wants to wean off of this.  Not using vaginal estrogen Rx.  Going to [place].   PCP:   Dr. Lavone Orn   Patient's last menstrual period was 10/24/2008.           Sexually active: No.  The current method of family planning is tubal ligation/LAVH/BSO.    Exercising: Yes.    treadmill Smoker:  no  Health Maintenance: Pap: 03-24-16 Neg:Neg HR HPV, 02-12-15 ASCUS:Neg HR HPV History of abnormal Pap:  Yes, 2010 CIN II--see pathology MMG: 05-26-17 Density B/Neg/BiRads1:TBC Colonoscopy: 06-04-17 done with [facility]   BMD:  03-15-12   Result  Normal with PCP TDaP: 2007- doesn't think it has been updated with PCP Gardasil:   no HIV: never Hep C: will check with PCP  Screening Labs: PCP, discuss with provider Hb today: same   reports that she quit smoking about 22 years ago. She has never used smokeless tobacco. She reports that she drinks about 8.4 oz of alcohol per week . She reports that she uses drugs, including Marijuana.  Past Medical History:  Diagnosis Date  . Abdominal pain   . Arthritis    in the knees  . CIN II (cervical intraepithelial neoplasia II) 2010   --seen on pathology after hysterectomy  . 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 Hypertension   . Osteopenia   . STD (sexually transmitted disease) 1980   hx HSV I of buttocks    Past Surgical History:  Procedure Laterality Date  . ABDOMINAL HYSTERECTOMY  2010  . APPENDECTOMY    . CHOLECYSTECTOMY  10/15/2012   Procedure: LAPAROSCOPIC CHOLECYSTECTOMY WITH INTRAOPERATIVE CHOLANGIOGRAM;  Surgeon: Gayland Curry, MD,FACS;  Location: [place];  Service: General;  Laterality: N/A;  . LAPAROSCOPIC ASSISTED VAGINAL HYSTERECTOMY  1/10   focal CIN II on path  . OVARIAN CYST SURGERY  Age 53  . TUBAL LIGATION  1985    Current Outpatient Prescriptions  Medication Sig Dispense Refill  . estradiol (ESTRACE) 0.5 MG  tablet Take 1 tablet (0.5 mg total) by mouth daily. 30 tablet 11  . lisinopril-hydrochlorothiazide (PRINZIDE,ZESTORETIC) 10-12.5 MG per tablet Take 1 tablet by mouth daily.    . valACYclovir (VALTREX) 1000 MG tablet Take one tablet (1000 mg)  by mouth twice a day for 3 days. 30 tablet 2   No current facility-administered medications for this visit.     Family History  Problem Relation Age of Onset  . Cancer Father   . Breast cancer Other 40       BRCA neg  . Heart disease Mother   . Diabetes Mother   . Hypertension Mother   . Hypertension Sister   . Hypertension Brother   . Stroke Maternal Grandmother     ROS:  Pertinent items are noted in HPI.  Otherwise, a comprehensive ROS was negative.  Exam:   BP 126/76 (BP Location: Right Arm, Patient Position: Sitting, Cuff Size: Normal)   Pulse 68   Resp 14   Ht '5\' 1"'  (1.549 m)   Wt 140 lb (63.5 kg)   LMP 10/24/2008   BMI 26.45 kg/m     General appearance: alert, cooperative and appears stated age Head: Normocephalic, without obvious abnormality, atraumatic Neck: no adenopathy, supple, symmetrical, trachea midline and thyroid normal to inspection and palpation Lungs: clear to auscultation bilaterally Breasts: normal appearance, no masses or  tenderness, No nipple retraction or dimpling, No nipple discharge or bleeding, No axillary or supraclavicular adenopathy Heart: regular rate and rhythm Abdomen: soft, non-tender; no masses, no organomegaly Extremities: extremities normal, atraumatic, no cyanosis or edema Skin: Skin color, texture, turgor normal. No rashes or lesions Lymph nodes: Cervical, supraclavicular, and axillary nodes normal. No abnormal inguinal nodes palpated Neurologic: Grossly normal  Pelvic: External genitalia:  no lesions              Urethra:  normal appearing urethra with no masses, tenderness or lesions              Bartholins and Skenes: normal                 Vagina: normal appearing vagina with normal color  and discharge, no lesions.  Scar tissue at vaginal apex a little to the right of midline - 4 -5 mm area.              Cervix:  Absent.               Pap taken: Yes.   Bimanual Exam:  Uterus:   Absent.               Adnexa: no mass, fullness, tenderness              Rectal exam: Yes.  .  Confirms.              Anus:  normal sphincter tone, no lesions  Chaperone was present for exam.  Assessment:   Well woman visit with normal exam. Status post LAVH/BSO.  CIN II on final hysterectomy specimen in 2010.  Hx HSV I.  Hx vaginal atrophy.  ERT.   Plan: Mammogram screening discussed. Recommended self breast awareness. Pap and HR HPV as above. Guidelines for Calcium, Vitamin D, regular exercise program including cardiovascular and weight bearing exercise. TDap today.  Labs with PCP.  Discussed ERT and potential risks of stroke, DVT, PE, and possible breast cancer.  Refill of estrace for 4 months during which patient will wean off.  Refill of Vagifem for one year.  Follow up annually and prn.    After visit summary provided.

## 2017-06-09 ENCOUNTER — Other Ambulatory Visit (HOSPITAL_COMMUNITY)
Admission: RE | Admit: 2017-06-09 | Discharge: 2017-06-09 | Disposition: A | Discharge status: Home / Self Care | Payer: BLUE CROSS/BLUE SHIELD | Source: Ambulatory Visit | Attending: Obstetrics and Gynecology | Admitting: Obstetrics and Gynecology

## 2017-06-09 ENCOUNTER — Ambulatory Visit (INDEPENDENT_AMBULATORY_CARE_PROVIDER_SITE_OTHER): Payer: BLUE CROSS/BLUE SHIELD | Admitting: Obstetrics and Gynecology

## 2017-06-09 ENCOUNTER — Encounter: Payer: Self-pay | Admitting: Obstetrics and Gynecology

## 2017-06-09 VITALS — BP 126/76 | HR 68 | Resp 14 | Ht 61.0 in | Wt 140.0 lb

## 2017-06-09 DIAGNOSIS — Z01419 Encounter for gynecological examination (general) (routine) without abnormal findings: Secondary | ICD-10-CM | POA: Diagnosis not present

## 2017-06-09 DIAGNOSIS — Z23 Encounter for immunization: Secondary | ICD-10-CM | POA: Diagnosis not present

## 2017-06-09 MED ORDER — ESTRADIOL 0.5 MG PO TABS: 0.5000 mg | ORAL_TABLET | Freq: Every day | ORAL | 3 refills | Status: DC

## 2017-06-09 MED ORDER — ESTRADIOL 10 MCG VA TABS: ORAL_TABLET | VAGINAL | 11 refills | Status: DC

## 2017-06-09 NOTE — Patient Instructions (Signed)

## 2017-06-09 NOTE — Addendum Note (Signed)
Addended by: Ardell Isaacs, Debbe Bales E on: 06/09/2017 04:45 PM   Modules accepted: Orders

## 2017-06-12 LAB — CYTOLOGY - PAP
DIAGNOSIS: NEGATIVE
HPV (WINDOPATH): NOT DETECTED

## 2017-07-13 DIAGNOSIS — I10 Essential (primary) hypertension: Secondary | ICD-10-CM | POA: Diagnosis not present

## 2017-12-08 DIAGNOSIS — H168 Other keratitis: Secondary | ICD-10-CM | POA: Diagnosis not present

## 2017-12-16 DIAGNOSIS — Z0000 Encounter for general adult medical examination without abnormal findings: Secondary | ICD-10-CM | POA: Diagnosis not present

## 2017-12-16 DIAGNOSIS — I10 Essential (primary) hypertension: Secondary | ICD-10-CM | POA: Diagnosis not present

## 2018-05-31 ENCOUNTER — Other Ambulatory Visit: Payer: Self-pay | Admitting: Obstetrics and Gynecology

## 2018-05-31 DIAGNOSIS — Z1231 Encounter for screening mammogram for malignant neoplasm of breast: Secondary | ICD-10-CM

## 2018-06-02 DIAGNOSIS — I10 Essential (primary) hypertension: Secondary | ICD-10-CM | POA: Diagnosis not present

## 2018-06-28 ENCOUNTER — Ambulatory Visit (INDEPENDENT_AMBULATORY_CARE_PROVIDER_SITE_OTHER): Payer: BLUE CROSS/BLUE SHIELD | Admitting: Obstetrics and Gynecology

## 2018-06-28 ENCOUNTER — Other Ambulatory Visit: Payer: Self-pay

## 2018-06-28 ENCOUNTER — Encounter: Payer: Self-pay | Admitting: Obstetrics and Gynecology

## 2018-06-28 ENCOUNTER — Other Ambulatory Visit (HOSPITAL_COMMUNITY)
Admission: RE | Admit: 2018-06-28 | Discharge: 2018-06-28 | Disposition: A | Discharge status: Home / Self Care | Payer: BLUE CROSS/BLUE SHIELD | Source: Ambulatory Visit | Attending: Obstetrics and Gynecology | Admitting: Obstetrics and Gynecology

## 2018-06-28 VITALS — BP 122/84 | HR 84 | Ht 61.0 in | Wt 143.0 lb

## 2018-06-28 DIAGNOSIS — Z01419 Encounter for gynecological examination (general) (routine) without abnormal findings: Secondary | ICD-10-CM

## 2018-06-28 MED ORDER — ESTROGENS, CONJUGATED 0.625 MG/GM VA CREA: TOPICAL_CREAM | VAGINAL | 2 refills | Status: DC

## 2018-06-28 NOTE — Patient Instructions (Signed)

## 2018-06-28 NOTE — Progress Notes (Signed)
62 y.o. G8P2002 Married Caucasian female here for annual exam.    Off all estrogens.  Felt good about stopping it.   Not sleeping well.  Using Niquil.  Exercising more.   She will do her labs with PCP.   PCP:  Dr. Laurann Montana  Patient's last menstrual period was 10/24/2008.           Sexually active: No.  The current method of family planning is status post hysterectomy.    Exercising: Yes.    yoga and treadmill Smoker:  Former smoker, quit in [place] Maintenance: Pap:  06/09/2017 negative History of abnormal Pap:  Yes, 2010 CIN II--see pathology MMG:  05/26/2017 BI-RADS CATEGORY  1: Negative. Colonoscopy:  2018 per patient - due in 3 years due to polyps. BMD:   2013  Result  Normal  TDaP:  2018 Gardasil:   no HIV: never tested Hep C: never tested Screening Labs:  PCP.   reports that she quit smoking about 23 years ago. She has never used smokeless tobacco. She reports that she drinks about 14.0 standard drinks of alcohol per week. She reports that she has current or past drug history. Drug: Marijuana.  Past Medical History:  Diagnosis Date  . Abdominal pain   . Arthritis    in the knees  . CIN II (cervical intraepithelial neoplasia II) 2010   --seen on pathology after hysterectomy  . 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 Hypertension   . Osteopenia   . STD (sexually transmitted disease) 1980   hx HSV I of buttocks    Past Surgical History:  Procedure Laterality Date  . ABDOMINAL HYSTERECTOMY  2010  . APPENDECTOMY    . CHOLECYSTECTOMY  10/15/2012   Procedure: LAPAROSCOPIC CHOLECYSTECTOMY WITH INTRAOPERATIVE CHOLANGIOGRAM;  Surgeon: Gayland Curry, MD,FACS;  Location: [facility];  Service: General;  Laterality: N/A;  . LAPAROSCOPIC ASSISTED VAGINAL HYSTERECTOMY  1/10   focal CIN II on path  . OVARIAN CYST SURGERY  Age 31  . TUBAL LIGATION  1985    Current Outpatient Medications  Medication Sig Dispense Refill  . lisinopril-hydrochlorothiazide (PRINZIDE,ZESTORETIC)  10-12.5 MG per tablet Take 1 tablet by mouth daily.    . valACYclovir (VALTREX) 1000 MG tablet Take 1,000 mg by mouth as needed. Takes one tablet by mouth once a day for 2 days     No current facility-administered medications for this visit.     Family History  Problem Relation Age of Onset  . Cancer Father   . Breast cancer Other 40       BRCA neg  . Heart disease Mother   . Diabetes Mother   . Hypertension Mother   . Hypertension Sister   . Hypertension Brother   . Stroke Maternal Grandmother     Review of Systems  Constitutional: Negative.   HENT: Negative.   Eyes: Negative.   Respiratory: Negative.   Cardiovascular: Negative.   Gastrointestinal: Negative.   Endocrine: Negative.   Genitourinary: Negative.   Musculoskeletal: Negative.   Skin: Negative.   Allergic/Immunologic: Negative.   Neurological: Negative.   Hematological: Negative.   Psychiatric/Behavioral: Negative.   All other systems reviewed and are negative.   Exam:   BP 122/84   Pulse 84   Ht _0  (1.549 m)   Wt 143 lb (64.9 kg)   LMP 10/24/2008   BMI 27.02 kg/m     General appearance: alert, cooperative and appears stated age Head: Normocephalic, without obvious abnormality, atraumatic Neck:  no adenopathy, supple, symmetrical, trachea midline and thyroid normal to inspection and palpation Lungs: clear to auscultation bilaterally Breasts: normal appearance, no masses or tenderness, No nipple retraction or dimpling, No nipple discharge or bleeding, No axillary or supraclavicular adenopathy Heart: regular rate and rhythm Abdomen: soft, non-tender; no masses, no organomegaly Extremities: extremities normal, atraumatic, no cyanosis or edema Skin: Skin color, texture, turgor normal. No rashes or lesions Lymph nodes: Cervical, supraclavicular, and axillary nodes normal. No abnormal inguinal nodes palpated Neurologic: Grossly normal  Pelvic: External genitalia:  no lesions              Urethra:   normal appearing urethra with no masses, tenderness or lesions              Bartholins and Skenes: normal                 Vagina: normal appearing vagina with normal color and discharge, atrophy noted.              Cervix:  absent              Pap taken:  Yes. Bimanual Exam:  Uterus:   absent              Adnexa: no mass, fullness, tenderness              Rectal exam: Yes.  .  Confirms.              Anus:  normal sphincter tone, no lesions  Chaperone was present for exam.  Assessment:   Well woman visit with normal exam. Status post LAVH/BSO.  CIN II on final hysterectomy specimen in 2010.  Hx HSV I.  Hx vaginal atrophy.  Off ERT.   Plan: Mammogram screening. Recommended self breast awareness. Pap and HR HPV as above. Guidelines for Calcium, Vitamin D, regular exercise program including cardiovascular and weight bearing exercise. Rx for Premarin vaginal cream. Discussed effect on breast cancer.  Coupons for Premarin. Labs with PCP. Follow up annually and prn.   After visit summary provided.

## 2018-06-30 LAB — CYTOLOGY - PAP
Diagnosis: NEGATIVE
HPV: NOT DETECTED

## 2018-07-02 ENCOUNTER — Ambulatory Visit
Admission: RE | Admit: 2018-07-02 | Discharge: 2018-07-02 | Disposition: A | Discharge status: Home / Self Care | Payer: BLUE CROSS/BLUE SHIELD | Source: Ambulatory Visit | Attending: Obstetrics and Gynecology | Admitting: Obstetrics and Gynecology

## 2018-07-02 DIAGNOSIS — Z1231 Encounter for screening mammogram for malignant neoplasm of breast: Secondary | ICD-10-CM | POA: Diagnosis not present

## 2018-10-14 ENCOUNTER — Encounter: Payer: Self-pay | Admitting: Obstetrics and Gynecology

## 2018-10-14 ENCOUNTER — Telehealth: Payer: Self-pay | Admitting: Obstetrics and Gynecology

## 2018-10-14 MED ORDER — ESTRADIOL 10 MCG VA TABS: ORAL_TABLET | VAGINAL | 8 refills | Status: DC

## 2018-10-14 NOTE — Telephone Encounter (Signed)
Patient sent the following message through MyChart. Routing to triage to assist patient with request.   Happy New Year! My last visit, you prescribed the premarinvaginalcream. Previously, I was using the Yuvafem estradiol vaginal inserts - .  I believe this was a generic and wasn't extremely expensive. Would you mind sending a prescription for it to my pharmacy, [facility]?  I prefer it to the messy goo. Thank you!  Carmen Ball  [phone]

## 2018-10-14 NOTE — Telephone Encounter (Signed)
Last AEX 06/28/18 Next AEX: not scheduled Patient notified of Yuvafem Rx. Patient to f/u with pharmacy for filling.   [facility], spoke with Mellody DanceKeith. Premarin vaginal cream cancelled.   Encounter closed.

## 2018-10-14 NOTE — Telephone Encounter (Signed)
Ok for [employer] 10 mcg pv at hs nightly for 2 weeks and then twice weekly for maintenance dosing.  Ok for refills until annual exam is due.  Please cancel her prescription for estrogen cream.

## 2018-10-14 NOTE — Telephone Encounter (Signed)
Routing to Dr. Edward Jolly to advise on yuvafem Rx.    Last AEX 06/28/18

## 2018-11-26 ENCOUNTER — Telehealth: Payer: Self-pay | Admitting: Obstetrics and Gynecology

## 2018-11-26 MED ORDER — VALACYCLOVIR HCL 1 G PO TABS: 1000.0000 mg | ORAL_TABLET | ORAL | 2 refills | Status: DC | PRN

## 2018-11-26 NOTE — Telephone Encounter (Signed)
Call returned to patient, name identified on voicemail. Left detailed message advising requested refill has been sent to [facility] [address]. F/u with pharmacy for filling. Return call to office if any additional questions.   Encounter closed.

## 2018-11-26 NOTE — Telephone Encounter (Signed)
Patient called and requested refills on her generic Valtrex to be sent to the pharmacy below as soon as possible. She said she is having an outbreak and getting ready to go out of town. Patient also said her pharmacy did not have this prescription on file for some reason.  [facility] [address]

## 2018-11-26 NOTE — Telephone Encounter (Signed)
Med refill request: Valtrex 1000mg  tab po prn Last AEX: 06/28/18 Next AEX: Not scheduled  Hx of HSV1 Dr. Edward Jolly, Please Advise?

## 2018-11-26 NOTE — Telephone Encounter (Signed)
Ok for Valtrex 1000 mg.  #30, RF two.

## 2019-01-14 IMAGING — MG DIGITAL SCREENING BILATERAL MAMMOGRAM WITH TOMO AND CAD
6 series · 6 of 18 positions shown · non-contrast
Comparison: Previous exam(s).

CLINICAL DATA: Screening.

EXAM:
DIGITAL SCREENING BILATERAL MAMMOGRAM WITH TOMO AND CAD

[R CC synth-2D]
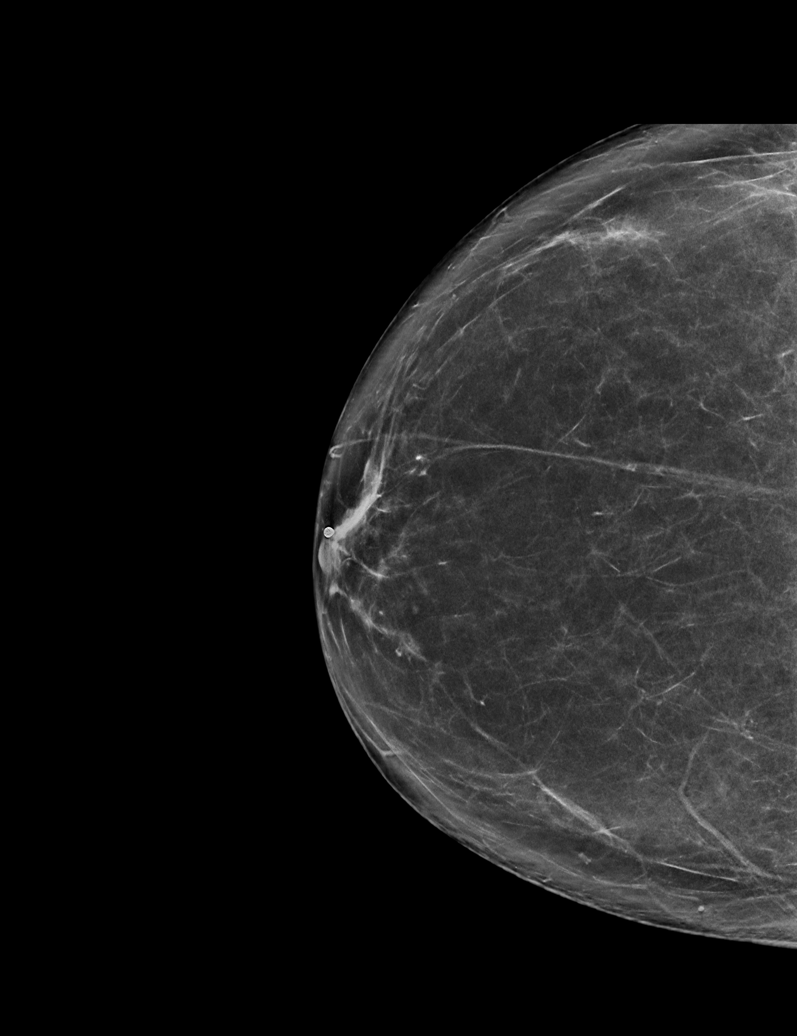

[L CC synth-2D]
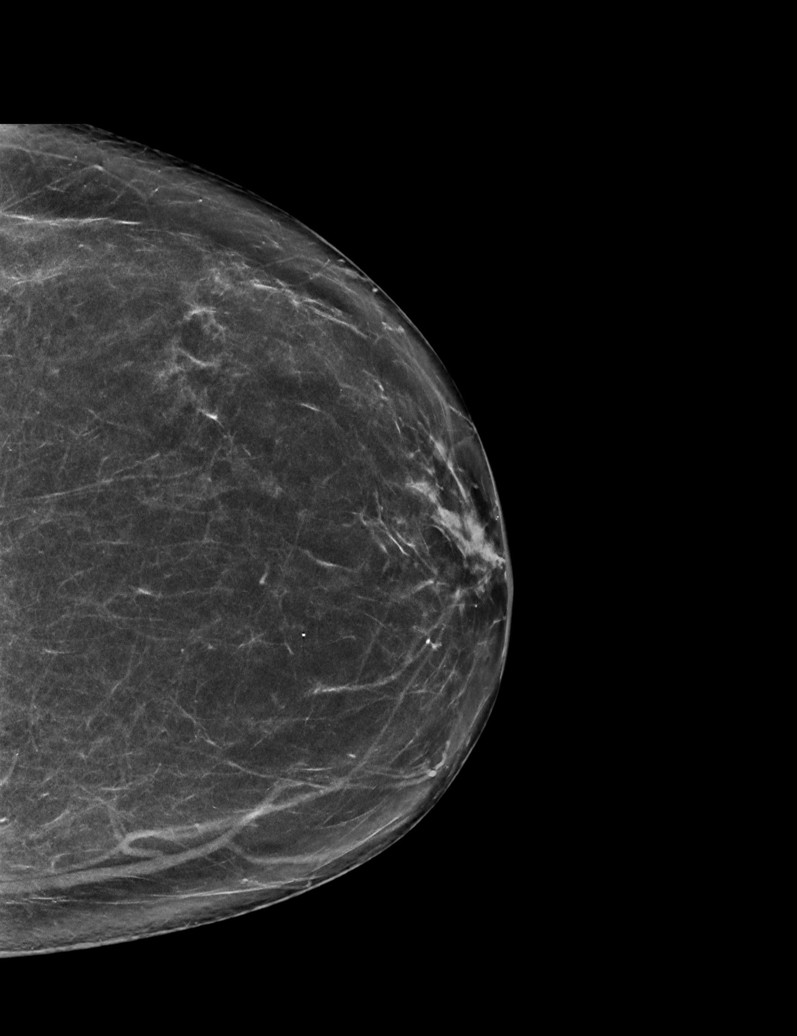

[L MLO synth-2D]
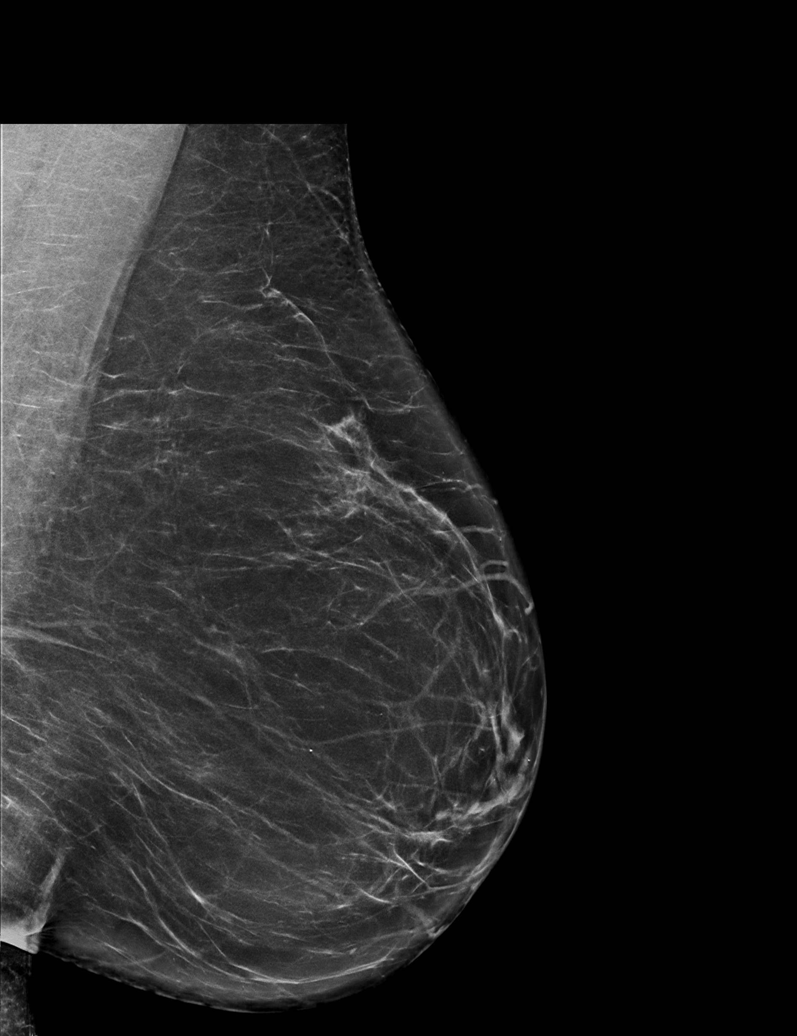

[L MLO tomo · tomo slice 39/77.0]
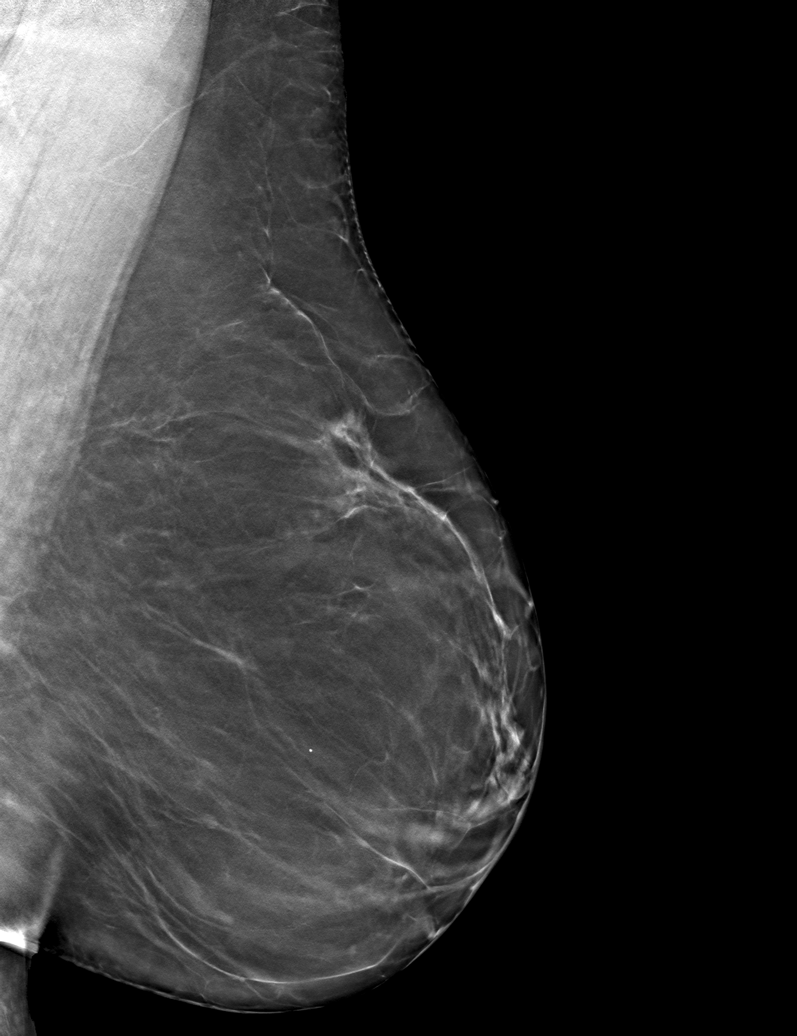

[R MLO tomo · tomo slice 37/74.0]
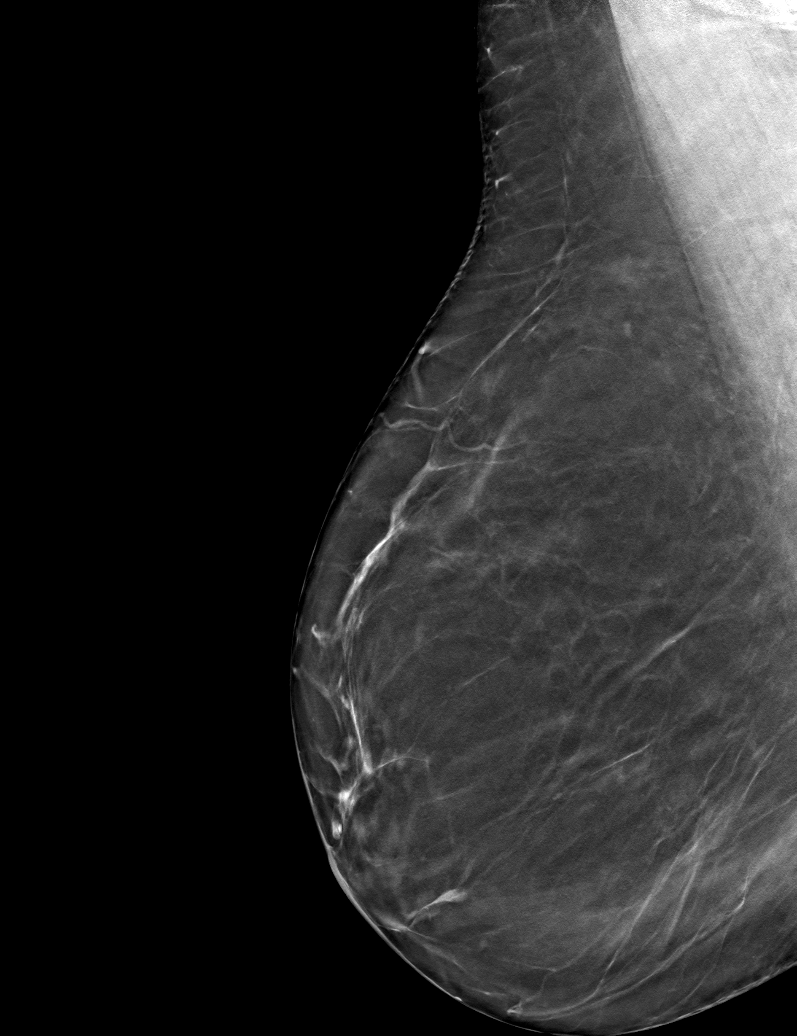

[R CC tomo · tomo slice 37/72.0]
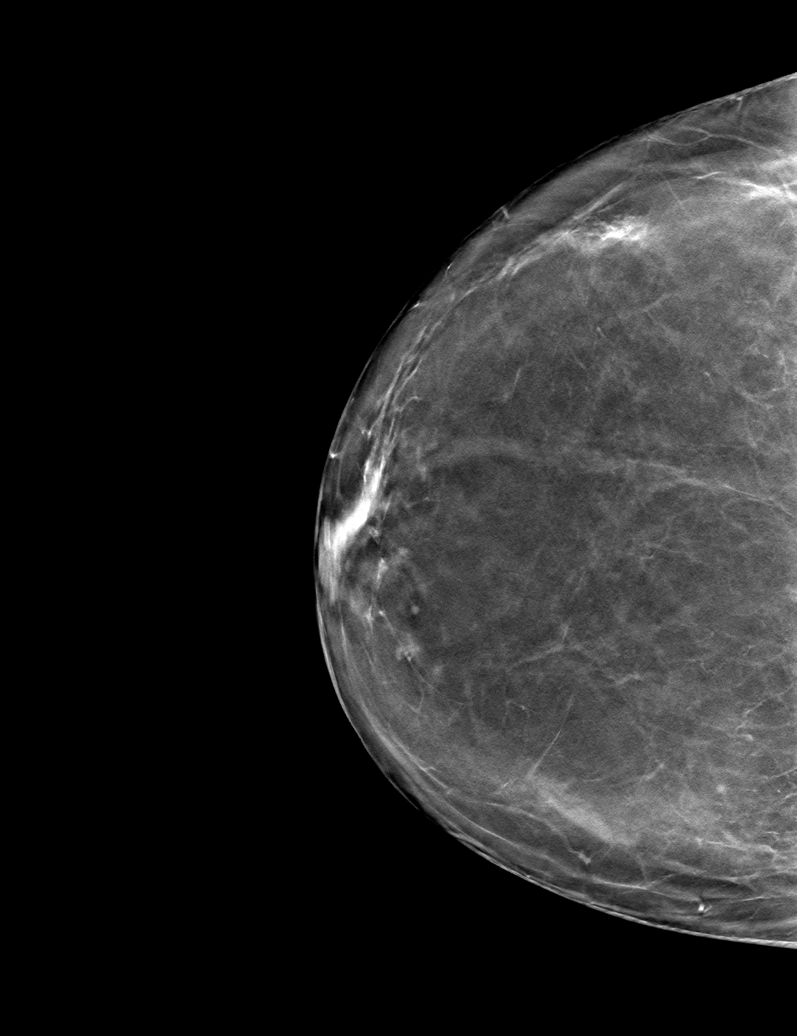

[6 of 18 positions shown; findings below may reference images not displayed]

ACR Breast Density Category b: There are scattered areas of
fibroglandular density.
FINDINGS: There are no findings suspicious for malignancy. Images were
processed with CAD.
IMPRESSION: No mammographic evidence of malignancy. A result letter of this
screening mammogram will be mailed directly to the patient.

RECOMMENDATION:
Screening mammogram in one year. (Code:[id number])

BI-RADS CATEGORY  1: Negative.

## 2019-07-07 ENCOUNTER — Other Ambulatory Visit: Payer: Self-pay | Admitting: Obstetrics and Gynecology

## 2019-07-07 ENCOUNTER — Other Ambulatory Visit: Payer: Self-pay

## 2019-07-07 DIAGNOSIS — Z1231 Encounter for screening mammogram for malignant neoplasm of breast: Secondary | ICD-10-CM

## 2019-07-11 ENCOUNTER — Ambulatory Visit (INDEPENDENT_AMBULATORY_CARE_PROVIDER_SITE_OTHER): Payer: BC Managed Care – PPO | Admitting: Obstetrics and Gynecology

## 2019-07-11 ENCOUNTER — Encounter: Payer: Self-pay | Admitting: Obstetrics and Gynecology

## 2019-07-11 ENCOUNTER — Other Ambulatory Visit: Payer: Self-pay

## 2019-07-11 VITALS — BP 130/80 | HR 88 | Temp 98.3°F | Resp 14 | Ht 60.5 in | Wt 126.0 lb

## 2019-07-11 DIAGNOSIS — Z01419 Encounter for gynecological examination (general) (routine) without abnormal findings: Secondary | ICD-10-CM | POA: Diagnosis not present

## 2019-07-11 MED ORDER — ESTRADIOL 10 MCG VA TABS: ORAL_TABLET | VAGINAL | 3 refills | Status: DC

## 2019-07-11 NOTE — Progress Notes (Signed)
63 y.o. G19P2002 Married Caucasian female here for annual exam.    Lost 20 pounds intentionally.  Now off anti HTN.  Is now painting.  Really likes drawing.   PCP: Lavone Orn, MD  Patient's last menstrual period was 10/24/2008.           Sexually active: No.  The current method of family planning is status post hysterectomy.    Exercising: No.  exercises occasionally Smoker: Former, quit in [place] Maintenance: Pap: 06-28-18 Neg:Neg HR HPV,06-09-17 Neg History of abnormal Pap:  Yes, 2010 CIN II--see pathology MMG: 07-02-18 3D/Neg/density B/BiRads1--appt. 08-22-19 Colonoscopy:  2018 per patient - due in 3 years due to polyps. BMD:  2013  Result :normal.    TDaP:  2018 Gardasil:   n/a QZE:[id number] Hep C:never  Screening Labs:   ---   reports that she quit smoking about 24 years ago. She has never used smokeless tobacco. She reports current alcohol use of about 14.0 standard drinks of alcohol per week. She reports previous drug use.  Past Medical History:  Diagnosis Date  . Abdominal pain   . Arthritis    in the knees  . CIN II (cervical intraepithelial neoplasia II) 2010   --seen on pathology after hysterectomy  . 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 Hypertension   . Osteopenia   . STD (sexually transmitted disease) 1980   hx HSV I of buttocks    Past Surgical History:  Procedure Laterality Date  . ABDOMINAL HYSTERECTOMY  2010  . APPENDECTOMY    . CHOLECYSTECTOMY  10/15/2012   Procedure: LAPAROSCOPIC CHOLECYSTECTOMY WITH INTRAOPERATIVE CHOLANGIOGRAM;  Surgeon: Gayland Curry, MD,FACS;  Location: [facility];  Service: General;  Laterality: N/A;  . LAPAROSCOPIC ASSISTED VAGINAL HYSTERECTOMY  1/10   focal CIN II on path  . OVARIAN CYST SURGERY  Age 67  . TUBAL LIGATION  1985    Current Outpatient Medications  Medication Sig Dispense Refill  . Estradiol (YUVAFEM) 10 MCG TABS vaginal tablet Place one tab pv at hs nightly for 2 weeks and then twice weekly. 18 tablet 8  .  valACYclovir (VALTREX) 1000 MG tablet Take 1 tablet (1,000 mg total) by mouth as needed. Takes one tablet by mouth once a day for 2 days 30 tablet 2  . lisinopril-hydrochlorothiazide (PRINZIDE,ZESTORETIC) 10-12.5 MG per tablet Take 1 tablet by mouth daily.     No current facility-administered medications for this visit.     Family History  Problem Relation Age of Onset  . Cancer Father   . Breast cancer Other 40       BRCA neg  . Heart disease Mother   . Diabetes Mother   . Hypertension Mother   . Hypertension Sister   . Hypertension Brother   . Stroke Maternal Grandmother     Review of Systems  All other systems reviewed and are negative.   Exam:   BP 130/80   Pulse 88   Temp 98.3 F (36.8 C) (Temporal)   Resp 14   Ht 5' 0.5" (1.537 m)   Wt 126 lb (57.2 kg)   LMP 10/24/2008   BMI 24.20 kg/m     General appearance: alert, cooperative and appears stated age Head: normocephalic, without obvious abnormality, atraumatic Neck: no adenopathy, supple, symmetrical, trachea midline and thyroid normal to inspection and palpation Lungs: clear to auscultation bilaterally Breasts: normal appearance, no masses or tenderness, No nipple retraction or dimpling, No nipple discharge or bleeding, No axillary adenopathy Heart: regular  rate and rhythm Abdomen: soft, non-tender; no masses, no organomegaly Extremities: extremities normal, atraumatic, no cyanosis or edema Skin: skin color, texture, turgor normal. No rashes or lesions Lymph nodes: cervical, supraclavicular, and axillary nodes normal. Neurologic: grossly normal  Pelvic: External genitalia:  no lesions              No abnormal inguinal nodes palpated.              Urethra:  normal appearing urethra with no masses, tenderness or lesions              Bartholins and Skenes: normal                 Vagina: normal appearing vagina with normal color and discharge, no lesions.  Atrophy noted.               Cervix: absent               Pap taken: No. Bimanual Exam:  Uterus:  Absent.               Adnexa: no mass, fullness, tenderness              Rectal exam: Yes.  .  Confirms.              Anus:  normal sphincter tone, no lesions  Chaperone was present for exam.  Assessment:   Well woman visit with normal exam. Status post LAVH/BSO.  CIN II on final hysterectomy specimen in 2010.  Hx HSV I.  Hx vaginal atrophy.  Off ERT.   Plan: Mammogram screening discussed. Self breast awareness reviewed. Pap and HR HPV in 2022.   5 year risk of CIN 3 0.35%. Guidelines for Calcium, Vitamin D, regular exercise program including cardiovascular and weight bearing exercise. Refill of Vagifem.  Flu vaccine recommended.  Labs with PCP.   Follow up annually and prn.   After visit summary provided.

## 2019-07-11 NOTE — Patient Instructions (Signed)

## 2019-08-22 ENCOUNTER — Ambulatory Visit
Admission: RE | Admit: 2019-08-22 | Discharge: 2019-08-22 | Disposition: A | Discharge status: Home / Self Care | Payer: BC Managed Care – PPO | Source: Ambulatory Visit | Attending: Obstetrics and Gynecology | Admitting: Obstetrics and Gynecology

## 2019-08-22 ENCOUNTER — Other Ambulatory Visit: Payer: Self-pay

## 2019-08-22 DIAGNOSIS — Z1231 Encounter for screening mammogram for malignant neoplasm of breast: Secondary | ICD-10-CM

## 2019-09-30 DIAGNOSIS — Z23 Encounter for immunization: Secondary | ICD-10-CM | POA: Diagnosis not present

## 2019-09-30 DIAGNOSIS — Z8249 Family history of ischemic heart disease and other diseases of the circulatory system: Secondary | ICD-10-CM | POA: Diagnosis not present

## 2019-09-30 DIAGNOSIS — E7800 Pure hypercholesterolemia, unspecified: Secondary | ICD-10-CM | POA: Diagnosis not present

## 2019-09-30 DIAGNOSIS — Z1159 Encounter for screening for other viral diseases: Secondary | ICD-10-CM | POA: Diagnosis not present

## 2019-09-30 DIAGNOSIS — Z0000 Encounter for general adult medical examination without abnormal findings: Secondary | ICD-10-CM | POA: Diagnosis not present

## 2019-09-30 DIAGNOSIS — I10 Essential (primary) hypertension: Secondary | ICD-10-CM | POA: Diagnosis not present

## 2020-03-05 ENCOUNTER — Other Ambulatory Visit: Payer: Self-pay | Admitting: Obstetrics and Gynecology

## 2020-03-05 NOTE — Telephone Encounter (Signed)
Medication refill request: Valtrex 1gm #30,R2 Last AEX:  07-11-19 Next AEX: 07-12-20 Last MMG (if hormonal medication request): n/a Refill authorized: Please refill if appropriate

## 2020-04-02 ENCOUNTER — Other Ambulatory Visit: Payer: Self-pay | Admitting: Obstetrics and Gynecology

## 2020-04-02 NOTE — Telephone Encounter (Signed)
Medication refill request: Valtrex Last AEX:  07/11/19 Dr. Edward Jolly Next AEX: 07/12/20 Last MMG (if hormonal medication request): n/a Refill authorized: Please advise; order pended #30 w/0 refills if authorized

## 2020-05-01 ENCOUNTER — Ambulatory Visit
Admission: RE | Admit: 2020-05-01 | Discharge: 2020-05-01 | Disposition: A | Discharge status: Home / Self Care | Payer: Self-pay | Source: Ambulatory Visit | Attending: Internal Medicine | Admitting: Internal Medicine

## 2020-05-01 ENCOUNTER — Other Ambulatory Visit: Payer: Self-pay | Admitting: Internal Medicine

## 2020-05-01 DIAGNOSIS — M79645 Pain in left finger(s): Secondary | ICD-10-CM

## 2020-07-11 NOTE — Progress Notes (Signed)
64 y.o. G91P2002 Married Caucasian female here for annual exam.    Ran out of vaginal estrogen tablets.  Would consider restarting.   Did her Covid vaccination.   PCP: Lavone Orn, MD    Patient's last menstrual period was 10/24/2008.           Sexually active: Yes.    The current method of family planning is status post hysterectomy.    Exercising: Yes.    walking Smoker:  Former, quit [place] Maintenance: Pap: 06-28-18 Neg:Neg HR HPV,06-09-17 Neg, 03-24-16 Neg:Neg HR HPV History of abnormal Pap:  Yes, 2010 CIN II--see pathology MMG: 08-22-19  3D/Neg/density B/BiRads1 Colonoscopy: 2018 per patient- due 3 years BMD: 03-15-12  Result :Normal TDaP: 06-09-17 Gardasil:   no HIV: never Hep C: never Screening Labs: PCP Shingrix:  Completed.    reports that she quit smoking about 25 years ago. She has never used smokeless tobacco. She reports current alcohol use of about 1.0 standard drink of alcohol per week. She reports previous drug use.  Past Medical History:  Diagnosis Date  . Abdominal pain   . Arthritis    in the knees  . CIN II (cervical intraepithelial neoplasia II) 2010   --seen on pathology after hysterectomy  . 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 Hypertension   . Osteopenia   . STD (sexually transmitted disease) 1980   hx HSV I of buttocks    Past Surgical History:  Procedure Laterality Date  . ABDOMINAL HYSTERECTOMY  2010  . APPENDECTOMY    . CHOLECYSTECTOMY  10/15/2012   Procedure: LAPAROSCOPIC CHOLECYSTECTOMY WITH INTRAOPERATIVE CHOLANGIOGRAM;  Surgeon: Gayland Curry, MD,FACS;  Location: [place];  Service: General;  Laterality: N/A;  . LAPAROSCOPIC ASSISTED VAGINAL HYSTERECTOMY  1/10   focal CIN II on path  . OVARIAN CYST SURGERY  Age 61  . TUBAL LIGATION  1985    Current Outpatient Medications  Medication Sig Dispense Refill  . lisinopril-hydrochlorothiazide (PRINZIDE,ZESTORETIC) 10-12.5 MG per tablet Take 1 tablet by mouth daily.    . valACYclovir  (VALTREX) 1000 MG tablet TAKE 1 TABLET BY MOUTH AS NEEDED. TAKE 1 TABLET BY MOUTH ONCE A DAY FOR 2 DAYS 30 tablet 0  . Estradiol (YUVAFEM) 10 MCG TABS vaginal tablet Place one tab pv at hs nightly for 2 weeks and then twice weekly. (Patient not taking: Reported on 07/12/2020) 34 tablet 3   No current facility-administered medications for this visit.    Family History  Problem Relation Age of Onset  . Cancer Father   . Breast cancer Other 40       BRCA neg  . Heart disease Mother   . Diabetes Mother   . Hypertension Mother   . Hypertension Sister   . Hypertension Brother   . Stroke Maternal Grandmother     Review of Systems  Constitutional: Negative.   HENT: Negative.   Eyes: Negative.   Respiratory: Negative.   Cardiovascular: Negative.   Gastrointestinal: Negative.   Endocrine: Negative.   Genitourinary: Negative.   Musculoskeletal: Negative.   Skin: Negative.   Allergic/Immunologic: Negative.   Neurological: Negative.   Hematological: Negative.   Psychiatric/Behavioral: Negative.     Exam:   BP 126/70 (BP Location: Right Arm, Patient Position: Sitting, Cuff Size: Normal)   Pulse 72   Resp 14   Ht 5' 0.5" (1.537 m)   Wt 131 lb (59.4 kg)   LMP 10/24/2008   BMI 25.16 kg/m     General appearance:  alert, cooperative and appears stated age Head: normocephalic, without obvious abnormality, atraumatic Neck: no adenopathy, supple, symmetrical, trachea midline and thyroid normal to inspection and palpation Lungs: clear to auscultation bilaterally Breasts: normal appearance, no masses or tenderness, No nipple retraction or dimpling, No nipple discharge or bleeding, No axillary adenopathy Heart: regular rate and rhythm Abdomen: soft, non-tender; no masses, no organomegaly Extremities: extremities normal, atraumatic, no cyanosis or edema Skin: skin color, texture, turgor normal. No rashes or lesions Lymph nodes: cervical, supraclavicular, and axillary nodes  normal. Neurologic: grossly normal  Pelvic: External genitalia:  no lesions              No abnormal inguinal nodes palpated.              Urethra:  normal appearing urethra with no masses, tenderness or lesions              Bartholins and Skenes: normal                 Vagina: normal appearing vagina with normal color and discharge, no lesions              Cervix: absent.              Pap taken: No. Bimanual Exam:  Uterus:  absent              Adnexa: no mass, fullness, tenderness              Rectal exam: Yes.  .  Confirms.              Anus:  normal sphincter tone, no lesions  Chaperone was present for exam.  Assessment:   Well woman visit with normal exam. Status post LAVH/BSO.  CIN II on final hysterectomy specimen in 2010.  Hx HSV I.  Hx vaginal atrophy. Off ERT.  Plan: Mammogram screening discussed. Self breast awareness reviewed. Pap and HR HPV 2022. Guidelines for Calcium, Vitamin D, regular exercise program including cardiovascular and weight bearing exercise. She will let me know if she needs a refill of the Vagifem.  I discussed potential effect on breast cancer.  BMD order placed.  She will contact her GI about her next colonoscopy.  Follow up annually and prn.   After visit summary provided.

## 2020-07-12 ENCOUNTER — Other Ambulatory Visit: Payer: Self-pay

## 2020-07-12 ENCOUNTER — Encounter: Payer: Self-pay | Admitting: Obstetrics and Gynecology

## 2020-07-12 ENCOUNTER — Ambulatory Visit (INDEPENDENT_AMBULATORY_CARE_PROVIDER_SITE_OTHER): Payer: No Typology Code available for payment source | Admitting: Obstetrics and Gynecology

## 2020-07-12 VITALS — BP 126/70 | HR 72 | Resp 14 | Ht 60.5 in | Wt 131.0 lb

## 2020-07-12 DIAGNOSIS — Z780 Asymptomatic menopausal state: Secondary | ICD-10-CM

## 2020-07-12 DIAGNOSIS — Z01419 Encounter for gynecological examination (general) (routine) without abnormal findings: Secondary | ICD-10-CM | POA: Diagnosis not present

## 2020-07-12 NOTE — Patient Instructions (Signed)

## 2020-08-21 ENCOUNTER — Ambulatory Visit: Payer: No Typology Code available for payment source | Attending: Internal Medicine

## 2020-08-21 ENCOUNTER — Other Ambulatory Visit (HOSPITAL_BASED_OUTPATIENT_CLINIC_OR_DEPARTMENT_OTHER): Payer: Self-pay | Admitting: Internal Medicine

## 2020-08-21 DIAGNOSIS — Z23 Encounter for immunization: Secondary | ICD-10-CM

## 2020-08-21 MED FILL — FLUARIX QUADRIVALENT 0.5 ML: 0.5 | 1 days supply | Qty: 1 | Fill #0

## 2020-08-24 MED FILL — PFIZER-BIONTECH COVID-19 VA: 30 | 1 days supply | Qty: 0 | Fill #0

## 2020-09-03 ENCOUNTER — Other Ambulatory Visit: Payer: Self-pay | Admitting: Obstetrics and Gynecology

## 2020-09-03 DIAGNOSIS — Z1231 Encounter for screening mammogram for malignant neoplasm of breast: Secondary | ICD-10-CM

## 2020-12-06 ENCOUNTER — Other Ambulatory Visit: Payer: Self-pay | Admitting: Internal Medicine

## 2020-12-06 DIAGNOSIS — F5101 Primary insomnia: Secondary | ICD-10-CM | POA: Diagnosis not present

## 2020-12-06 DIAGNOSIS — I10 Essential (primary) hypertension: Secondary | ICD-10-CM | POA: Diagnosis not present

## 2020-12-06 DIAGNOSIS — E7800 Pure hypercholesterolemia, unspecified: Secondary | ICD-10-CM | POA: Diagnosis not present

## 2020-12-06 DIAGNOSIS — Z0000 Encounter for general adult medical examination without abnormal findings: Secondary | ICD-10-CM | POA: Diagnosis not present

## 2020-12-06 DIAGNOSIS — E538 Deficiency of other specified B group vitamins: Secondary | ICD-10-CM | POA: Diagnosis not present

## 2020-12-06 DIAGNOSIS — Z23 Encounter for immunization: Secondary | ICD-10-CM | POA: Diagnosis not present

## 2020-12-06 DIAGNOSIS — R413 Other amnesia: Secondary | ICD-10-CM | POA: Diagnosis not present

## 2020-12-07 ENCOUNTER — Other Ambulatory Visit: Payer: Self-pay | Admitting: Obstetrics and Gynecology

## 2020-12-07 DIAGNOSIS — E2839 Other primary ovarian failure: Secondary | ICD-10-CM

## 2020-12-12 ENCOUNTER — Other Ambulatory Visit: Payer: No Typology Code available for payment source

## 2020-12-12 ENCOUNTER — Other Ambulatory Visit: Payer: Self-pay

## 2020-12-12 ENCOUNTER — Ambulatory Visit
Admission: RE | Admit: 2020-12-12 | Discharge: 2020-12-12 | Disposition: A | Discharge status: Home / Self Care | Payer: PPO | Source: Ambulatory Visit | Attending: Obstetrics and Gynecology | Admitting: Obstetrics and Gynecology

## 2020-12-12 DIAGNOSIS — Z1231 Encounter for screening mammogram for malignant neoplasm of breast: Secondary | ICD-10-CM

## 2020-12-20 ENCOUNTER — Other Ambulatory Visit: Payer: Self-pay

## 2020-12-20 ENCOUNTER — Ambulatory Visit
Admission: RE | Admit: 2020-12-20 | Discharge: 2020-12-20 | Disposition: A | Discharge status: Home / Self Care | Payer: PPO | Source: Ambulatory Visit | Attending: Obstetrics and Gynecology | Admitting: Obstetrics and Gynecology

## 2020-12-20 DIAGNOSIS — E2839 Other primary ovarian failure: Secondary | ICD-10-CM

## 2020-12-21 DIAGNOSIS — Z780 Asymptomatic menopausal state: Secondary | ICD-10-CM | POA: Diagnosis not present

## 2020-12-21 DIAGNOSIS — M8589 Other specified disorders of bone density and structure, multiple sites: Secondary | ICD-10-CM | POA: Diagnosis not present

## 2021-01-01 ENCOUNTER — Ambulatory Visit
Admission: RE | Admit: 2021-01-01 | Discharge: 2021-01-01 | Disposition: A | Discharge status: Home / Self Care | Payer: Self-pay | Source: Ambulatory Visit | Attending: Internal Medicine | Admitting: Internal Medicine

## 2021-01-01 DIAGNOSIS — E7800 Pure hypercholesterolemia, unspecified: Secondary | ICD-10-CM

## 2021-02-14 DIAGNOSIS — E538 Deficiency of other specified B group vitamins: Secondary | ICD-10-CM | POA: Diagnosis not present

## 2021-02-14 DIAGNOSIS — E7800 Pure hypercholesterolemia, unspecified: Secondary | ICD-10-CM | POA: Diagnosis not present

## 2021-02-14 DIAGNOSIS — Z5181 Encounter for therapeutic drug level monitoring: Secondary | ICD-10-CM | POA: Diagnosis not present

## 2021-06-03 DIAGNOSIS — H4321 Crystalline deposits in vitreous body, right eye: Secondary | ICD-10-CM | POA: Diagnosis not present

## 2021-06-06 DIAGNOSIS — E7800 Pure hypercholesterolemia, unspecified: Secondary | ICD-10-CM | POA: Diagnosis not present

## 2021-06-06 DIAGNOSIS — R3129 Other microscopic hematuria: Secondary | ICD-10-CM | POA: Diagnosis not present

## 2021-07-17 NOTE — Progress Notes (Signed)
GYNECOLOGY  VISIT   HPI: 65 y.o.   Married  Caucasian  female   G2P2002 with Patient's last menstrual period was 10/24/2008.   here for breast and pelvic exam.   Easy bruising a couple of times a year.   Has vaginal dryness.  Wants to restart the Vagifem.  Decreased libido.   Some difficulty with sleep.   Retiring now.  Paints pet portraits. Does have some concerns about the world.   TV on a lot.   GYNECOLOGIC HISTORY: Patient's last menstrual period was 10/24/2008. Contraception:  Hyst Menopausal hormone therapy:  none Last mammogram:  12-12-20 3D/Neg/BiRads1 Last pap smear:  06-28-18 Neg:Neg HR HPV,06-09-17 Neg, 03-24-16 Neg:Neg HR HPV        OB History     Gravida  2   Para  2   Term  2   Preterm      AB      Living  2      SAB      IAB      Ectopic      Multiple      Live Births  1              There are no problems to display for this patient.   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   Buttock   Hypercholesteremia    Hypertension    Osteopenia    STD (sexually transmitted disease) 10/13/1978   hx HSV I of buttocks    Past Surgical History:  Procedure Laterality Date   ABDOMINAL HYSTERECTOMY  2010   APPENDECTOMY     CHOLECYSTECTOMY  10/15/2012   Procedure: LAPAROSCOPIC CHOLECYSTECTOMY WITH INTRAOPERATIVE CHOLANGIOGRAM;  Surgeon: Gayland Curry, MD,FACS;  Location: [facility];  Service: General;  Laterality: N/A;   LAPAROSCOPIC ASSISTED VAGINAL HYSTERECTOMY  1/10   focal CIN II on path   OVARIAN CYST SURGERY  Age 68   TUBAL LIGATION  1985    Current Outpatient Medications  Medication Sig Dispense Refill   COVID-19 mRNA vaccine, Pfizer, 30 MCG/0.3ML injection INJECT AS DIRECTED .3 mL 0   lisinopril-hydrochlorothiazide (PRINZIDE,ZESTORETIC) 10-12.5 MG per tablet  Take 1 tablet by mouth daily.     PFIZER-BIONT COVID-19 VAC-TRIS SUSP injection      rosuvastatin (CRESTOR) 10 MG tablet Take 10 mg by mouth daily.     Estradiol (YUVAFEM) 10 MCG TABS vaginal tablet Place one tab pv at hs nightly for 2 weeks and then twice weekly. (Patient not taking: No sig reported) 34 tablet 3   valACYclovir (VALTREX) 1000 MG tablet TAKE 1 TABLET BY MOUTH AS NEEDED. TAKE 1 TABLET BY MOUTH ONCE A DAY FOR 2 DAYS (Patient not taking: Reported on 07/18/2021) 30 tablet 0   No current facility-administered medications for this visit.     ALLERGIES: Patient has no known allergies.  Family History  Problem Relation Age of Onset   Cancer Father    Breast cancer Other 5       BRCA neg   Heart disease Mother    Diabetes Mother    Hypertension Mother    Hypertension Sister    Hypertension Brother    Stroke Maternal Grandmother     Social History   Socioeconomic History   Marital status: Married    Spouse  name: Not on file   Number of children: Not on file   Years of education: Not on file   Highest education level: Not on file  Occupational History   Not on file  Tobacco Use   Smoking status: Former    Types: Cigarettes    Quit date: 09/23/1994    Years since quitting: 26.8   Smokeless tobacco: Never  Vaping Use   Vaping Use: Never used  Substance and Sexual Activity   Alcohol use: Not Currently    Alcohol/week: 14.0 standard drinks    Types: 14 Glasses of wine per week    Comment: occasional    Drug use: Not Currently    Comment: once/month   Sexual activity: Yes    Partners: Female    Birth control/protection: Surgical    Comment: LAVH/BSO  Other Topics Concern   Not on file  Social History Narrative   Not on file   Social Determinants of Health   Financial Resource Strain: Not on file  Food Insecurity: Not on file  Transportation Needs: Not on file  Physical Activity: Not on file  Stress: Not on file  Social Connections: Not on file   Intimate Partner Violence: Not on file    Review of Systems  Hematological:  Bruises/bleeds easily.  Psychiatric/Behavioral:  Positive for sleep disturbance.   All other systems reviewed and are negative.  PHYSICAL EXAMINATION:    BP 110/78   Pulse (!) 104   Ht 5' 0.5" (1.537 m)   Wt 134 lb (60.8 kg)   LMP 10/24/2008   SpO2 99%   BMI 25.74 kg/m     General appearance: alert, cooperative and appears stated age.  Tearful at times. Head: Normocephalic, without obvious abnormality, atraumatic Neck: no adenopathy, supple, symmetrical, trachea midline and thyroid normal to inspection and palpation Lungs: clear to auscultation bilaterally Breasts: normal appearance, no masses or tenderness, No nipple retraction or dimpling, No nipple discharge or bleeding, No axillary or supraclavicular adenopathy Heart: regular rate and rhythm Abdomen: soft, non-tender, no masses,  no organomegaly Extremities: extremities normal, atraumatic, no cyanosis or edema Skin: Skin color, texture, turgor normal. No rashes or lesions Lymph nodes: Cervical, supraclavicular, and axillary nodes normal. No abnormal inguinal nodes palpated Neurologic: Grossly normal  Pelvic: External genitalia:  no lesions              Urethra:  normal appearing urethra with no masses, tenderness or lesions              Bartholins and Skenes: normal                 Vagina: normal appearing vagina with normal color and discharge, no lesions              Cervix: absent                Bimanual Exam:  Uterus:  absent              Adnexa: no mass, fullness, tenderness              Rectal exam: yes.  Confirms.              Anus:  normal sphincter tone, no lesions  Chaperone was present for exam:  Estill Bamberg, CMA  ASSESSMENT  Status post LAVH/BSO.  CIN II on final hysterectomy specimen in 2010.  Hx HSV I.  Hx vaginal atrophy.  Off ERT.  Some anxiety.  Decreased libido.  PLAN  Pap  and HR HPV collected.  Yearly mammogram.   Self breast exam encouraged.  Restart Vagifem.  I discussed potential effect on breast cancer.  Refill Valtrex.  We discussed decreasing her exposure to the TV and news.  I gave her a brochure for [facility].  She declines a trial of Wellbutrin or testosterone after discussing these options. FU yearly and prn.    An After Visit Summary was printed and given to the patient.  30 min  total time was spent for this patient encounter, including preparation, face-to-face counseling with the patient, coordination of care, and documentation of the encounter.

## 2021-07-18 ENCOUNTER — Other Ambulatory Visit (HOSPITAL_COMMUNITY)
Admission: RE | Admit: 2021-07-18 | Discharge: 2021-07-18 | Disposition: A | Discharge status: Home / Self Care | Payer: PPO | Source: Ambulatory Visit | Attending: Obstetrics and Gynecology | Admitting: Obstetrics and Gynecology

## 2021-07-18 ENCOUNTER — Encounter: Payer: Self-pay | Admitting: Obstetrics and Gynecology

## 2021-07-18 ENCOUNTER — Other Ambulatory Visit: Payer: Self-pay

## 2021-07-18 ENCOUNTER — Ambulatory Visit: Payer: No Typology Code available for payment source | Admitting: Obstetrics and Gynecology

## 2021-07-18 ENCOUNTER — Ambulatory Visit (INDEPENDENT_AMBULATORY_CARE_PROVIDER_SITE_OTHER): Payer: PPO | Admitting: Obstetrics and Gynecology

## 2021-07-18 VITALS — BP 110/78 | HR 104 | Ht 60.5 in | Wt 134.0 lb

## 2021-07-18 DIAGNOSIS — Z01419 Encounter for gynecological examination (general) (routine) without abnormal findings: Secondary | ICD-10-CM | POA: Insufficient documentation

## 2021-07-18 DIAGNOSIS — Z1151 Encounter for screening for human papillomavirus (HPV): Secondary | ICD-10-CM | POA: Insufficient documentation

## 2021-07-18 DIAGNOSIS — R6882 Decreased libido: Secondary | ICD-10-CM

## 2021-07-18 DIAGNOSIS — B009 Herpesviral infection, unspecified: Secondary | ICD-10-CM | POA: Diagnosis not present

## 2021-07-18 DIAGNOSIS — N952 Postmenopausal atrophic vaginitis: Secondary | ICD-10-CM

## 2021-07-18 MED ORDER — ESTRADIOL 10 MCG VA TABS: ORAL_TABLET | VAGINAL | 3 refills | Status: DC

## 2021-07-18 MED ORDER — VALACYCLOVIR HCL 1 G PO TABS: ORAL_TABLET | ORAL | 1 refills | Status: DC

## 2021-07-18 NOTE — Patient Instructions (Signed)

## 2021-07-22 LAB — CYTOLOGY - PAP
Comment: NEGATIVE
Diagnosis: NEGATIVE
High risk HPV: NEGATIVE

## 2021-07-26 ENCOUNTER — Other Ambulatory Visit: Payer: Self-pay | Admitting: Obstetrics and Gynecology

## 2021-07-26 NOTE — Telephone Encounter (Signed)
[facility] 1000mg .  Rx sent on 07/18/21 for #30/1RF  [facility], spoke with 09/17/21. Confirmed Rx on file. No additional Rx needed.  Rx refused.   Encounter closed.

## 2021-10-15 DIAGNOSIS — Z7184 Encounter for health counseling related to travel: Secondary | ICD-10-CM | POA: Diagnosis not present

## 2022-02-18 ENCOUNTER — Other Ambulatory Visit: Payer: Self-pay | Admitting: Obstetrics and Gynecology

## 2022-02-18 DIAGNOSIS — Z1231 Encounter for screening mammogram for malignant neoplasm of breast: Secondary | ICD-10-CM

## 2022-02-20 ENCOUNTER — Ambulatory Visit
Admission: RE | Admit: 2022-02-20 | Discharge: 2022-02-20 | Disposition: A | Discharge status: Home / Self Care | Payer: PPO | Source: Ambulatory Visit | Attending: Obstetrics and Gynecology | Admitting: Obstetrics and Gynecology

## 2022-02-20 DIAGNOSIS — Z1231 Encounter for screening mammogram for malignant neoplasm of breast: Secondary | ICD-10-CM

## 2022-03-20 DIAGNOSIS — Z0000 Encounter for general adult medical examination without abnormal findings: Secondary | ICD-10-CM | POA: Diagnosis not present

## 2022-03-20 DIAGNOSIS — E7800 Pure hypercholesterolemia, unspecified: Secondary | ICD-10-CM | POA: Diagnosis not present

## 2022-03-20 DIAGNOSIS — I10 Essential (primary) hypertension: Secondary | ICD-10-CM | POA: Diagnosis not present

## 2022-03-20 DIAGNOSIS — R413 Other amnesia: Secondary | ICD-10-CM | POA: Diagnosis not present

## 2022-03-20 DIAGNOSIS — Z1331 Encounter for screening for depression: Secondary | ICD-10-CM | POA: Diagnosis not present

## 2022-07-23 ENCOUNTER — Ambulatory Visit: Payer: PPO | Admitting: Obstetrics and Gynecology

## 2022-07-29 DIAGNOSIS — R413 Other amnesia: Secondary | ICD-10-CM | POA: Diagnosis not present

## 2022-07-29 DIAGNOSIS — I700 Atherosclerosis of aorta: Secondary | ICD-10-CM | POA: Diagnosis not present

## 2022-07-29 DIAGNOSIS — E7800 Pure hypercholesterolemia, unspecified: Secondary | ICD-10-CM | POA: Diagnosis not present

## 2022-07-29 DIAGNOSIS — I10 Essential (primary) hypertension: Secondary | ICD-10-CM | POA: Diagnosis not present

## 2022-08-19 ENCOUNTER — Ambulatory Visit (INDEPENDENT_AMBULATORY_CARE_PROVIDER_SITE_OTHER): Payer: PPO | Admitting: Obstetrics and Gynecology

## 2022-08-19 ENCOUNTER — Encounter: Payer: Self-pay | Admitting: Obstetrics and Gynecology

## 2022-08-19 ENCOUNTER — Other Ambulatory Visit (HOSPITAL_COMMUNITY)
Admission: RE | Admit: 2022-08-19 | Discharge: 2022-08-19 | Disposition: A | Discharge status: Home / Self Care | Payer: PPO | Source: Ambulatory Visit | Attending: Obstetrics and Gynecology | Admitting: Obstetrics and Gynecology

## 2022-08-19 VITALS — BP 120/80 | HR 82 | Resp 18 | Ht 60.43 in | Wt 140.2 lb

## 2022-08-19 DIAGNOSIS — Z780 Asymptomatic menopausal state: Secondary | ICD-10-CM | POA: Insufficient documentation

## 2022-08-19 DIAGNOSIS — Z1272 Encounter for screening for malignant neoplasm of vagina: Secondary | ICD-10-CM | POA: Insufficient documentation

## 2022-08-19 DIAGNOSIS — M8580 Other specified disorders of bone density and structure, unspecified site: Secondary | ICD-10-CM | POA: Insufficient documentation

## 2022-08-19 DIAGNOSIS — Z1151 Encounter for screening for human papillomavirus (HPV): Secondary | ICD-10-CM | POA: Diagnosis not present

## 2022-08-19 DIAGNOSIS — Z01419 Encounter for gynecological examination (general) (routine) without abnormal findings: Secondary | ICD-10-CM | POA: Diagnosis not present

## 2022-08-19 DIAGNOSIS — B009 Herpesviral infection, unspecified: Secondary | ICD-10-CM

## 2022-08-19 DIAGNOSIS — Z87410 Personal history of cervical dysplasia: Secondary | ICD-10-CM | POA: Insufficient documentation

## 2022-08-19 DIAGNOSIS — Z9189 Other specified personal risk factors, not elsewhere classified: Secondary | ICD-10-CM | POA: Diagnosis not present

## 2022-08-19 MED ORDER — ESTRADIOL 0.1 MG/GM VA CREA: TOPICAL_CREAM | VAGINAL | 2 refills | Status: DC

## 2022-08-19 MED ORDER — VALACYCLOVIR HCL 1 G PO TABS: ORAL_TABLET | ORAL | 1 refills | Status: DC

## 2022-08-19 NOTE — Patient Instructions (Signed)

## 2022-08-19 NOTE — Progress Notes (Unsigned)
66 y.o. G41P2002 Married Caucasian female here for annual exam.    She is followed for vaginal atrophy.   Not having HSV outbreaks.  Uses valtrex prn.   Had a fall recently.   Semi retired. Traveling.   PCP:   Dr. Domingo Pulse  Patient's last menstrual period was 10/24/2008.           Sexually active:  No The current method of family planning is status post hysterectomy.    Exercising: Yes , walking Smoker:  no  Health Maintenance: Pap:  06/28/18 Neg, HPV neg; 07/18/21 neg, HPV neg History of abnormal Pap:  yes, CIN II MMG:  02/20/22, BiRads 1 NEG Colonoscopy:  2018 BMD:   12/25/20  Result  Osteopenia TDaP:  06/09/17 Gardasil:   no HIV: No per Pt Hep C: No per Pt Screening Labs:  PCP   reports that she quit smoking about 27 years ago. Her smoking use included cigarettes. She has never used smokeless tobacco. She reports that she does not currently use alcohol after a past usage of about 14.0 standard drinks of alcohol per week. She reports that she does not currently use drugs.  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   Buttock   Hypercholesteremia    Hypertension    Osteopenia    STD (sexually transmitted disease) 10/13/1978   hx HSV I of buttocks    Past Surgical History:  Procedure Laterality Date   ABDOMINAL HYSTERECTOMY  2010   APPENDECTOMY     CHOLECYSTECTOMY  10/15/2012   Procedure: LAPAROSCOPIC CHOLECYSTECTOMY WITH INTRAOPERATIVE CHOLANGIOGRAM;  Surgeon: Gayland Curry, MD,FACS;  Location: [facility];  Service: General;  Laterality: N/A;   LAPAROSCOPIC ASSISTED VAGINAL HYSTERECTOMY  1/10   focal CIN II on path   OVARIAN CYST SURGERY  Age 67   TUBAL LIGATION  1985    Current Outpatient Medications  Medication Sig Dispense Refill   lisinopril-hydrochlorothiazide (PRINZIDE,ZESTORETIC)  10-12.5 MG per tablet Take 1 tablet by mouth daily.     rosuvastatin (CRESTOR) 10 MG tablet Take 10 mg by mouth daily.     valACYclovir (VALTREX) 1000 MG tablet TAKE 1 TABLET BY MOUTH AS NEEDED. TAKE 1 TABLET BY MOUTH ONCE A DAY FOR 2 DAYS 30 tablet 1   No current facility-administered medications for this visit.    Family History  Problem Relation Age of Onset   Cancer Father    Breast cancer Other 32       BRCA neg   Heart disease Mother    Diabetes Mother    Hypertension Mother    Hypertension Sister    Hypertension Brother    Stroke Maternal Grandmother     Review of Systems  All other systems reviewed and are negative.   Exam:   BP 120/80 (BP Location: Right Arm, Patient Position: Sitting)   Pulse 82   Resp 18   Ht 5' 0.43" (1.535 m)   Wt 140 lb 3.2 oz (63.6 kg)   LMP 10/24/2008   SpO2 99%   BMI 26.99 kg/m     General appearance: alert, cooperative and appears stated age Head: normocephalic, without obvious abnormality, atraumatic Neck: no adenopathy, supple, symmetrical, trachea midline and thyroid normal to inspection and palpation Lungs: clear to auscultation bilaterally Breasts: normal appearance, no masses or  tenderness, No nipple retraction or dimpling, No nipple discharge or bleeding, No axillary adenopathy Heart: regular rate and rhythm Abdomen: soft, non-tender; no masses, no organomegaly Extremities: extremities normal, atraumatic, no cyanosis or edema Skin: skin color, texture, turgor normal. No rashes or lesions Lymph nodes: cervical, supraclavicular, and axillary nodes normal. Neurologic: grossly normal  Pelvic: External genitalia:  no lesions              No abnormal inguinal nodes palpated.              Urethra:  normal appearing urethra with no masses, tenderness or lesions              Bartholins and Skenes: normal                 Vagina: normal appearing vagina with normal color and discharge, no lesions              Cervix: no lesions               Pap taken: {yes no:314532} Bimanual Exam:  Uterus:  normal size, contour, position, consistency, mobility, non-tender              Adnexa: no mass, fullness, tenderness              Rectal exam: {yes no:314532}.  Confirms.              Anus:  normal sphincter tone, no lesions  Chaperone was present for exam:  ***  Assessment:   Well woman visit with gynecologic exam. Status post LAVH/BSO.  CIN II on final hysterectomy specimen in 2010.  Hx HSV I.  Hx vaginal atrophy.  Off ERT.  Some anxiety.  Decreased libido.  Plan: Mammogram screening discussed. Self breast awareness reviewed. Pap and HR HPV as above. Guidelines for Calcium, Vitamin D, regular exercise program including cardiovascular and weight bearing exercise.   Follow up annually and prn.   Additional counseling given.  {yes [id number]. _______ minutes face to face time of which over 50% was spent in counseling.    After visit summary provided.

## 2022-08-21 LAB — CYTOLOGY - PAP
Comment: NEGATIVE
Diagnosis: NEGATIVE
High risk HPV: NEGATIVE

## 2022-09-22 ENCOUNTER — Other Ambulatory Visit: Payer: Self-pay | Admitting: Obstetrics and Gynecology

## 2022-11-04 DIAGNOSIS — Z86010 Personal history of colonic polyps: Secondary | ICD-10-CM | POA: Diagnosis not present

## 2022-11-04 DIAGNOSIS — K648 Other hemorrhoids: Secondary | ICD-10-CM | POA: Diagnosis not present

## 2022-11-04 DIAGNOSIS — K5730 Diverticulosis of large intestine without perforation or abscess without bleeding: Secondary | ICD-10-CM | POA: Diagnosis not present

## 2022-11-04 DIAGNOSIS — D123 Benign neoplasm of transverse colon: Secondary | ICD-10-CM | POA: Diagnosis not present

## 2022-11-04 DIAGNOSIS — Z09 Encounter for follow-up examination after completed treatment for conditions other than malignant neoplasm: Secondary | ICD-10-CM | POA: Diagnosis not present

## 2022-11-04 DIAGNOSIS — D125 Benign neoplasm of sigmoid colon: Secondary | ICD-10-CM | POA: Diagnosis not present

## 2022-11-06 DIAGNOSIS — D125 Benign neoplasm of sigmoid colon: Secondary | ICD-10-CM | POA: Diagnosis not present

## 2023-02-06 ENCOUNTER — Ambulatory Visit
Admission: RE | Admit: 2023-02-06 | Discharge: 2023-02-06 | Disposition: A | Discharge status: Home / Self Care | Payer: PPO | Source: Ambulatory Visit | Attending: Obstetrics and Gynecology | Admitting: Obstetrics and Gynecology

## 2023-02-06 DIAGNOSIS — Z780 Asymptomatic menopausal state: Secondary | ICD-10-CM

## 2023-02-06 DIAGNOSIS — M8580 Other specified disorders of bone density and structure, unspecified site: Secondary | ICD-10-CM

## 2023-03-06 ENCOUNTER — Other Ambulatory Visit: Payer: Self-pay | Admitting: Obstetrics and Gynecology

## 2023-03-06 DIAGNOSIS — Z1231 Encounter for screening mammogram for malignant neoplasm of breast: Secondary | ICD-10-CM

## 2023-03-10 ENCOUNTER — Ambulatory Visit
Admission: RE | Admit: 2023-03-10 | Discharge: 2023-03-10 | Disposition: A | Discharge status: Home / Self Care | Payer: PPO | Source: Ambulatory Visit | Attending: Obstetrics and Gynecology | Admitting: Obstetrics and Gynecology

## 2023-03-10 DIAGNOSIS — Z1231 Encounter for screening mammogram for malignant neoplasm of breast: Secondary | ICD-10-CM

## 2023-06-23 ENCOUNTER — Telehealth: Payer: Self-pay | Admitting: Neurology

## 2023-06-23 ENCOUNTER — Encounter: Payer: Self-pay | Admitting: Neurology

## 2023-06-23 ENCOUNTER — Ambulatory Visit: Payer: PPO | Admitting: Neurology

## 2023-06-23 VITALS — BP 118/70 | Ht 61.0 in | Wt 141.0 lb

## 2023-06-23 DIAGNOSIS — G3184 Mild cognitive impairment, so stated: Secondary | ICD-10-CM

## 2023-06-23 NOTE — Progress Notes (Signed)
[facility]  PATIENT: Carmen Ball DOB: 03/07/1956  REQUESTING CLINICIAN: Lorenda Ishihara,* HISTORY FROM: Patient/Husband  REASON FOR VISIT: Memory loss    HISTORICAL  CHIEF COMPLAINT:  Chief Complaint  Patient presents with   New Patient (Initial Visit)    Rm 12,  NP with husband Carmen Ball, [place] 20    HISTORY OF PRESENT ILLNESS:  This is a 67 year old woman past medical history of hypertension, hyperlipidemia who is presenting with memory problem.  Patient tells me that her memory problem has been going on for the past 6 years and getting worse.  She just cannot remember.  Sometimes she has difficulties remembering what she had for dinner last night.  She reports she had difficulty with specific details.   Husband tells me that patient memory has been getting worse in the past 6 years.  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  Husband tells me that she is forgetful, she is repetitive, asks the same questions over and over.  She still independent in all activities of daily living.  She is still drive denies any recent accident or being lost in familiar places.    TBI:  No past history of TBI Stroke:    no past history of stroke Seizures:   no past history of seizures Sleep:   no history of sleep apnea.  H Mood: Yes, Depression after mother's death  Family history of Dementia: Paternal grandmother  Functional status: independent in all ADLs and IADLs Patient lives with husband. Cooking: no issues  Cleaning: no issues  Shopping: no issues  Bathing: no issues  Toileting: no issues  Driving: no issues  Bills: Husband took over the business bills but patient still pays household bills  Medications:  Ever left the stove on by accident?: Denies  Forget how to use items around the house?: Denies  Getting lost going to familiar  places?: Denies  Forgetting loved ones names?: Denies  Word finding difficulty? Yes  Sleep: Good    OTHER MEDICAL CONDITIONS: Hypertension, Hyperlipidemia   REVIEW OF SYSTEMS: Full 14 system review of systems performed and negative with exception of: As noted in the HPI   ALLERGIES: No Known Allergies  HOME MEDICATIONS: Outpatient Medications Prior to Visit  Medication Sig Dispense Refill   estradiol (ESTRACE) 0.1 MG/GM vaginal cream Use 1/2 g vaginally every night for the first 2 weeks, then use 1/2 g vaginally two or three times per week as needed to maintain symptom relief. 42.5 g 2   lisinopril-hydrochlorothiazide (PRINZIDE,ZESTORETIC) 10-12.5 MG per tablet Take 1 tablet by mouth daily.     rosuvastatin (CRESTOR) 10 MG tablet Take 10 mg by mouth daily.     valACYclovir (VALTREX) 1000 MG tablet TAKE 1 TABLET BY MOUTH AS NEEDED. TAKE 1 TABLET BY MOUTH ONCE A DAY FOR 5 DAYS AS NEEDED. 30 tablet 1   No facility-administered medications prior to visit.    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   Hypercholesteremia    Hypertension    Osteopenia    STD (sexually transmitted disease) 10/13/1978   hx HSV I of buttocks    PAST SURGICAL HISTORY: Past  Surgical History:  Procedure Laterality Date   ABDOMINAL HYSTERECTOMY  2010   APPENDECTOMY     CHOLECYSTECTOMY  10/15/2012   Procedure: LAPAROSCOPIC CHOLECYSTECTOMY WITH INTRAOPERATIVE CHOLANGIOGRAM;  Surgeon: Atilano Ina, MD,FACS;  Location: [facility] OR;  Service: General;  Laterality: N/A;   LAPAROSCOPIC ASSISTED VAGINAL HYSTERECTOMY  1/10   focal CIN II on path   OVARIAN CYST SURGERY  Age 30   TUBAL LIGATION  1985    FAMILY HISTORY: Family History  Problem Relation Age of Onset   Heart disease Mother    Diabetes Mother     Hypertension Mother    Cancer Father    Hypertension Sister    Stroke Maternal Grandmother    Hypertension Brother     SOCIAL HISTORY: Social History   Socioeconomic History   Marital status: Married    Spouse name: Not on file   Number of children: Not on file   Years of education: Not on file   Highest education level: Not on file  Occupational History   Not on file  Tobacco Use   Smoking status: Former    Current packs/day: 0.00    Types: Cigarettes    Quit date: 09/23/1994    Years since quitting: 28.7   Smokeless tobacco: Never  Vaping Use   Vaping status: Never Used  Substance and Sexual Activity   Alcohol use: Not Currently    Alcohol/week: 14.0 standard drinks of alcohol    Types: 14 Glasses of wine per week    Comment: occasional    Drug use: Not Currently    Comment: once/month   Sexual activity: Yes    Partners: Female    Birth control/protection: Surgical    Comment: LAVH/BSO; >5 partners; 44 yo  Other Topics Concern   Not on file  Social History Narrative   Right handed   Caffeine-2 cups daily   Social Determinants of Health   Financial Resource Strain: Not on file  Food Insecurity: Not on file  Transportation Needs: Not on file  Physical Activity: Not on file  Stress: Not on file  Social Connections: Not on file  Intimate Partner Violence: Not on file    PHYSICAL EXAM  GENERAL EXAM/CONSTITUTIONAL: Vitals:  Vitals:   06/23/23 0936  BP: 118/70  Weight: 141 lb (64 kg)  Height: 5\' 1"  (1.549 m)   Body mass index is 26.64 kg/m. Wt Readings from Last 3 Encounters:  06/23/23 141 lb (64 kg)  08/19/22 140 lb 3.2 oz (63.6 kg)  07/18/21 134 lb (60.8 kg)   Patient is in no distress; well developed, nourished and groomed; neck is supple  MUSCULOSKELETAL: Gait, strength, tone, movements noted in Neurologic exam below  NEUROLOGIC: MENTAL STATUS:      No data to display            06/23/2023    9:39 AM  Montreal Cognitive  Assessment   Visuospatial/ Executive (0/5) 5  Naming (0/3) 3  Attention: Read list of digits (0/2) 2  Attention: Read list of letters (0/1) 1  Attention: Serial 7 subtraction starting at 100 (0/3) 1  Language: Repeat phrase (0/2) 2  Language : Fluency (0/1) 0  Abstraction (0/2) 2  Delayed Recall (0/5) 0  Orientation (0/6) 4  Total 20    CRANIAL NERVE:  2nd, 3rd, 4th, 6th- visual fields full to confrontation, extraocular muscles intact, no nystagmus 5th - facial sensation symmetric 7th - facial strength symmetric 8th - hearing intact 9th - palate elevates symmetrically,  uvula midline 11th - shoulder shrug symmetric 12th - tongue protrusion midline  MOTOR:  normal bulk and tone, full strength in the BUE, BLE  SENSORY:  normal and symmetric to light touch  COORDINATION:  finger-nose-finger, fine finger movements normal  GAIT/STATION:  normal   DIAGNOSTIC DATA (LABS, IMAGING, TESTING) - I reviewed patient records, labs, notes, testing and imaging myself where available.  Lab Results  Component Value Date   WBC 9.7 10/04/2012   HGB 13.9 10/04/2012   HCT 40.9 10/04/2012   MCV 97.8 10/04/2012   PLT 283 10/04/2012      Component Value Date/Time   NA 136 10/04/2012 1500   K 3.5 10/04/2012 1500   CL 100 10/04/2012 1500   CO2 25 10/04/2012 1500   GLUCOSE 90 10/04/2012 1500   BUN 14 10/04/2012 1500   CREATININE 0.65 10/04/2012 1500   CALCIUM 9.5 10/04/2012 1500   PROT 7.2 10/04/2012 1500   ALBUMIN 3.9 10/04/2012 1500   AST 14 10/04/2012 1500   ALT 10 10/04/2012 1500   ALKPHOS 52 10/04/2012 1500   BILITOT 0.3 10/04/2012 1500   GFRNONAA >90 10/04/2012 1500   GFRAA >90 10/04/2012 1500   No results found for: "CHOL", "HDL", "LDLCALC", "LDLDIRECT", "TRIG", "CHOLHDL" No results found for: "HGBA1C" No results found for: "VITAMINB12" No results found for: "TSH"   ASSESSMENT AND PLAN  67 y.o. year old female with hypertension, hyperlipidemia who is presenting for  memory decline for the past 6 years and getting worse.  Memory decline described as being forgetful, repetitive, however she is still independent in all activities of daily living.  She scored 20 out of 30 on the MoCA indicative of impairment.  She was noted on exam to be anxious, and tearful.  She reports feelings anxious and stressed out to the fact that she cannot remember and does not want her family or friends to know that she cannot remember.  I have informed patient that she likely has mild cognitive impairment.  Plan for now is to obtain dementia lab including B12, TSH and ATN profile.  We will also obtain MRI brain.  I will refer the patient for formal neuropsychological testing and I will contact her to go over the result.  This was discussed with patient and husband and they are comfortable with plan.  We also discussed ways to reduce the risk of developing dementia including keeping good health, good diet, good sleep, and exercise.  I will see them in 1 year for follow-up or sooner if worse.   1. Mild cognitive impairment      Patient Instructions  Dementia labs including ATN profile, TSH and B12 MRI brain without contrast Referral to neuropsychology Continue follow-up PCP Return in 1 year or sooner if worse.  There are well-accepted and sensible ways to reduce risk for Alzheimers disease and other degenerative brain disorders .  Exercise Daily Walk A daily 20 minute walk should be part of your routine. Disease related apathy can be a significant roadblock to exercise and the only way to overcome this is to make it a daily routine and perhaps have a reward at the end (something your loved one loves to eat or drink perhaps) or a personal trainer coming to the home can also be very useful. Most importantly, the patient is much more likely to exercise if the caregiver / spouse does it with him/her. In general a structured, repetitive schedule is best.  General Health: Any diseases which  effect your body  will effect your brain such as a pneumonia, urinary infection, blood clot, heart attack or stroke. Keep contact with your primary care doctor for regular follow ups.  Sleep. A good nights sleep is healthy for the brain. Seven hours is recommended. If you have insomnia or poor sleep habits we can give you some instructions. If you have sleep apnea wear your mask.  Diet: Eating a heart healthy diet is also a good idea; fish and poultry instead of red meat, nuts (mostly non-peanuts), vegetables, fruits, olive oil or canola oil (instead of butter), minimal salt (use other spices to flavor foods), whole grain rice, bread, cereal and pasta and wine in moderation.Research is now showing that the MIND diet, which is a combination of The Mediterranean diet and the DASH diet, is beneficial for cognitive processing and longevity. Information about this diet can be found in The MIND Diet, a book by Alonna Minium, MS, RDN, and online at [employer]  Finances, Power of [address] and Advance Directives: You should consider putting legal safeguards in place with regard to financial and medical decision making. While the spouse always has power of attorney for medical and financial issues in the absence of any form, you should consider what you want in case the spouse / caregiver is no longer around or capable of making decisions.     Orders Placed This Encounter  Procedures   MR BRAIN WO CONTRAST   ATN PROFILE   Vitamin B12   TSH    No orders of the defined types were placed in this encounter.   Return in about 1 year (around 06/22/2024).  I have spent a total of 65 minutes dedicated to this patient today, preparing to see patient, performing a medically appropriate examination and evaluation, ordering tests and/or medications and procedures, and counseling and educating the patient/family/caregiver; independently interpreting result and communicating results to  the family/patient/caregiver; and documenting clinical information in the electronic medical record.   Windell Norfolk, MD 06/23/2023, 12:45 PM  [facility] [address] [place], [place] [address] [phone]

## 2023-06-23 NOTE — Patient Instructions (Signed)
Dementia labs including ATN profile, TSH and B12 MRI brain without contrast Referral to neuropsychology Continue follow-up PCP Return in 1 year or sooner if worse.  There are well-accepted and sensible ways to reduce risk for Alzheimers disease and other degenerative brain disorders .  Exercise Daily Walk A daily 20 minute walk should be part of your routine. Disease related apathy can be a significant roadblock to exercise and the only way to overcome this is to make it a daily routine and perhaps have a reward at the end (something your loved one loves to eat or drink perhaps) or a personal trainer coming to the home can also be very useful. Most importantly, the patient is much more likely to exercise if the caregiver / spouse does it with him/her. In general a structured, repetitive schedule is best.  General Health: Any diseases which effect your body will effect your brain such as a pneumonia, urinary infection, blood clot, heart attack or stroke. Keep contact with your primary care doctor for regular follow ups.  Sleep. A good nights sleep is healthy for the brain. Seven hours is recommended. If you have insomnia or poor sleep habits we can give you some instructions. If you have sleep apnea wear your mask.  Diet: Eating a heart healthy diet is also a good idea; fish and poultry instead of red meat, nuts (mostly non-peanuts), vegetables, fruits, olive oil or canola oil (instead of butter), minimal salt (use other spices to flavor foods), whole grain rice, bread, cereal and pasta and wine in moderation.Research is now showing that the MIND diet, which is a combination of The Mediterranean diet and the DASH diet, is beneficial for cognitive processing and longevity. Information about this diet can be found in The MIND Diet, a book by Alonna Minium, MS, RDN, and online at [employer]  Finances, Power of [address] and Advance Directives: You should consider putting  legal safeguards in place with regard to financial and medical decision making. While the spouse always has power of attorney for medical and financial issues in the absence of any form, you should consider what you want in case the spouse / caregiver is no longer around or capable of making decisions.

## 2023-06-23 NOTE — Telephone Encounter (Signed)
[place] adv NPR sent to [facility] [phone]

## 2023-06-25 ENCOUNTER — Telehealth: Payer: Self-pay | Admitting: Neurology

## 2023-06-25 NOTE — Telephone Encounter (Signed)
Referral for neuropsychology fax to [facility]. Phone:[phone], Fax: [phone]

## 2023-06-26 LAB — TSH: TSH: 4.93 u[IU]/mL — ABNORMAL HIGH (ref 0.450–4.500)

## 2023-06-26 LAB — ATN PROFILE
A -- Beta-amyloid 42/40 Ratio: 0.091 — ABNORMAL LOW (ref >0.102)
Beta-amyloid 40: 219.6 pg/mL
Beta-amyloid 42: 19.99 pg/mL
N -- NfL, Plasma: 3.25 pg/mL (ref 0.00–4.61)
T -- p-tau181: 1.14 pg/mL — ABNORMAL HIGH (ref 0.00–0.97)

## 2023-06-26 LAB — VITAMIN B12: Vitamin B-12: 623 pg/mL (ref 232–1245)

## 2023-07-02 ENCOUNTER — Ambulatory Visit
Admission: RE | Admit: 2023-07-02 | Discharge: 2023-07-02 | Disposition: A | Discharge status: Home / Self Care | Payer: PPO | Source: Ambulatory Visit | Attending: Neurology | Admitting: Neurology

## 2023-07-02 DIAGNOSIS — G3184 Mild cognitive impairment, so stated: Secondary | ICD-10-CM

## 2023-07-11 ENCOUNTER — Other Ambulatory Visit: Payer: PPO

## 2023-07-17 ENCOUNTER — Other Ambulatory Visit: Payer: PPO

## 2023-08-17 ENCOUNTER — Ambulatory Visit: Payer: PPO | Admitting: Neurology

## 2023-08-17 ENCOUNTER — Telehealth: Payer: Self-pay | Admitting: Neurology

## 2023-08-17 ENCOUNTER — Encounter: Payer: Self-pay | Admitting: Neurology

## 2023-08-17 VITALS — BP 146/82 | Ht 62.0 in | Wt 141.0 lb

## 2023-08-17 DIAGNOSIS — G309 Alzheimer's disease, unspecified: Secondary | ICD-10-CM

## 2023-08-17 DIAGNOSIS — G3184 Mild cognitive impairment, so stated: Secondary | ICD-10-CM

## 2023-08-17 MED ORDER — CITALOPRAM HYDROBROMIDE 20 MG PO TABS
20.0000 mg | ORAL_TABLET | Freq: Every day | ORAL | 6 refills | Status: DC
Start: 2023-08-17 — End: 2024-03-14

## 2023-08-17 NOTE — Progress Notes (Signed)
[facility]  PATIENT: Carmen Ball DOB: November 30, 1955  REQUESTING CLINICIAN: No ref. provider found HISTORY FROM: Patient/Husband  REASON FOR VISIT: Memory loss    HISTORICAL  CHIEF COMPLAINT:  Chief Complaint  Patient presents with   Follow-up    Rm 12, f/u, review/discuss MRI, treatment options. Patient scored 4 on functional assessment survey   INTERVAL HISTORY 08/17/2023:  Patient presents with husband to discuss treatment choices. Last visit was in September.  At that time we obtained an ATN profile which was positive for Alzheimer disease biomarker and patient is interested in pursuing the new treatment such as [place] and [place].  She reports since finding about her positive biomarker, she is more depressed, crying more easily.   HISTORY OF PRESENT ILLNESS:  This is a 67 year old woman past medical history of hypertension, hyperlipidemia who is presenting with memory problem.  Patient tells me that her memory problem has been going on for the past 6 years and getting worse.  She just cannot remember.  Sometimes she has difficulties remembering what she had for dinner last night.  She reports she had difficulty with specific details.   Husband tells me that patient memory has been getting worse in the past 6 years.  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  Husband tells me that she is forgetful, she is repetitive, asks the same questions over and over.  She still independent in all activities of daily living.  She is still drive denies any recent accident or being lost in familiar places.    TBI:  No past history of TBI Stroke:    no past history of stroke Seizures:   no past history of seizures Sleep:   no history of sleep apnea.  H Mood: Yes, Depression after mother's death  Family history of Dementia: Paternal  grandmother  Functional status: independent in all ADLs and IADLs Patient lives with husband. 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 Yes  Sleep: Good    OTHER MEDICAL CONDITIONS: Hypertension, Hyperlipidemia   REVIEW OF SYSTEMS: Full 14 system review of systems performed and negative with exception of: As noted in the HPI   ALLERGIES: No Known Allergies  HOME MEDICATIONS: Outpatient Medications Prior to Visit  Medication Sig Dispense Refill   estradiol (ESTRACE) 0.1 MG/GM vaginal cream Use 1/2 g vaginally every night for the first 2 weeks, then use 1/2 g vaginally two or three times per week as needed to maintain symptom relief. 42.5 g 2   lisinopril-hydrochlorothiazide (PRINZIDE,ZESTORETIC) 10-12.5 MG per tablet Take 1 tablet by mouth daily.     rosuvastatin (CRESTOR) 10 MG tablet Take 10 mg by mouth daily.     valACYclovir (VALTREX) 1000 MG tablet TAKE 1 TABLET BY MOUTH AS NEEDED. TAKE 1 TABLET BY MOUTH ONCE A DAY FOR 5 DAYS AS NEEDED. 30 tablet 1   No facility-administered medications prior to visit.    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   Buttock   Hypercholesteremia    Hypertension    Osteopenia    STD (sexually transmitted disease) 10/13/1978   hx HSV I of buttocks    PAST SURGICAL HISTORY: Past Surgical History:  Procedure Laterality Date   ABDOMINAL HYSTERECTOMY  2010    APPENDECTOMY     CHOLECYSTECTOMY  10/15/2012   Procedure: LAPAROSCOPIC CHOLECYSTECTOMY WITH INTRAOPERATIVE CHOLANGIOGRAM;  Surgeon: Atilano Ina, MD,FACS;  Location: [facility] OR;  Service: General;  Laterality: N/A;   LAPAROSCOPIC ASSISTED VAGINAL HYSTERECTOMY  1/10   focal CIN II on path   OVARIAN CYST SURGERY  Age 36   TUBAL LIGATION  1985    FAMILY HISTORY: Family History  Problem Relation Age of Onset   Heart disease Mother    Diabetes Mother    Hypertension Mother    Cancer Father    Hypertension Sister    Stroke Maternal Grandmother    Hypertension Brother     SOCIAL HISTORY: Social History   Socioeconomic History   Marital status: Married    Spouse name: Not on file   Number of children: Not on file   Years of education: Not on file   Highest education level: Not on file  Occupational History   Not on file  Tobacco Use   Smoking status: Former    Current packs/day: 0.00    Types: Cigarettes    Quit date: 09/23/1994    Years since quitting: 28.9   Smokeless tobacco: Never  Vaping Use   Vaping status: Never Used  Substance and Sexual Activity   Alcohol use: Not Currently    Alcohol/week: 14.0 standard drinks of alcohol    Types: 14 Glasses of wine per week    Comment: occasional    Drug use: Not Currently    Comment: once/month   Sexual activity: Yes    Partners: Female    Birth control/protection: Surgical    Comment: LAVH/BSO; >5 partners; 59 yo  Other Topics Concern   Not on file  Social History Narrative   Right handed   Caffeine-2 cups daily   Social Determinants of Health   Financial Resource Strain: Not on file  Food Insecurity: Not on file  Transportation Needs: Not on file  Physical Activity: Not on file  Stress: Not on file  Social Connections: Not on file  Intimate Partner Violence: Not on file    PHYSICAL EXAM  GENERAL EXAM/CONSTITUTIONAL: Vitals:  Vitals:   08/17/23 1512 08/17/23 1514  BP: (!) 140/84 (!) 146/82  Weight: 141 lb (64  kg)   Height: 5\' 2"  (1.575 m)    Body mass index is 25.79 kg/m. Wt Readings from Last 3 Encounters:  08/17/23 141 lb (64 kg)  06/23/23 141 lb (64 kg)  08/19/22 140 lb 3.2 oz (63.6 kg)   Patient is in no distress; well developed, nourished and groomed; neck is supple  MUSCULOSKELETAL: Gait, strength, tone, movements noted in Neurologic exam below  NEUROLOGIC: MENTAL STATUS:      No data to display            06/23/2023    9:39 AM  Montreal Cognitive Assessment   Visuospatial/ Executive (0/5) 5  Naming (0/3) 3  Attention: Read list of digits (0/2) 2  Attention: Read list of letters (0/1) 1  Attention:  Serial 7 subtraction starting at 100 (0/3) 1  Language: Repeat phrase (0/2) 2  Language : Fluency (0/1) 0  Abstraction (0/2) 2  Delayed Recall (0/5) 0  Orientation (0/6) 4  Total 20    CRANIAL NERVE:  2nd, 3rd, 4th, 6th- visual fields full to confrontation, extraocular muscles intact, no nystagmus 5th - facial sensation symmetric 7th - facial strength symmetric 8th - hearing intact 9th - palate elevates symmetrically, uvula midline 11th - shoulder shrug symmetric 12th - tongue protrusion midline  MOTOR:  normal bulk and tone, full strength in the BUE, BLE  SENSORY:  normal and symmetric to light touch  COORDINATION:  finger-nose-finger, fine finger movements normal  GAIT/STATION:  normal   DIAGNOSTIC DATA (LABS, IMAGING, TESTING) - I reviewed patient records, labs, notes, testing and imaging myself where available.  Lab Results  Component Value Date   WBC 9.7 10/04/2012   HGB 13.9 10/04/2012   HCT 40.9 10/04/2012   MCV 97.8 10/04/2012   PLT 283 10/04/2012      Component Value Date/Time   NA 136 10/04/2012 1500   K 3.5 10/04/2012 1500   CL 100 10/04/2012 1500   CO2 25 10/04/2012 1500   GLUCOSE 90 10/04/2012 1500   BUN 14 10/04/2012 1500   CREATININE 0.65 10/04/2012 1500   CALCIUM 9.5 10/04/2012 1500   PROT 7.2 10/04/2012 1500   ALBUMIN 3.9  10/04/2012 1500   AST 14 10/04/2012 1500   ALT 10 10/04/2012 1500   ALKPHOS 52 10/04/2012 1500   BILITOT 0.3 10/04/2012 1500   GFRNONAA >90 10/04/2012 1500   GFRAA >90 10/04/2012 1500   No results found for: "CHOL", "HDL", "LDLCALC", "LDLDIRECT", "TRIG", "CHOLHDL" No results found for: "HGBA1C" Lab Results  Component Value Date   VITAMINB12 623 06/23/2023   Lab Results  Component Value Date   TSH 4.930 (H) 06/23/2023   ATN profile positive for Alzheimer dementia biomarkers.    MRI Brain 07/02/2023 - Mild atrophy and mild chronic small vessel ischemic disease.  - No acute findings  ASSESSMENT AND PLAN  67 y.o. year old female with hypertension, hyperlipidemia, patient diagnosed with mild cognitive impairment who is presenting to discuss her treatment option.  Her ATN profile was positive for Alzheimer disease biomarker, patient mild cognitive impairment is likely due to Alzheimer disease.  We discussed medications such as Aricept and Namenda versus the new drug such as Leqembi and Kisunla, and patient would like to pursue the new monoclonal antibodies infusion.  Plan for now is to obtain ApoE4 genotype, PET amyloid to confirm Alzheimer disease, at that time we will reach out to patient and again discuss the 2 options, Leqembi every 2 weeks, vs Kisunla once a month and side effect including brain bleeding, brain swelling.  She voiced understanding.   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 I will contact you to go over the results, otherwise follow-up as scheduled.  Orders Placed This Encounter  Procedures   NM PET Brain Amyloid   APOE Alzheimer's Risk    Meds ordered this encounter  Medications   citalopram (CELEXA) 20 MG tablet    Sig: Take 1 tablet (20 mg total) by mouth daily.    Dispense:  30 tablet     Refill:  6    No follow-ups on file.   Windell Norfolk,  MD 08/17/2023, 5:45 PM  [facility] [address] [place], [place] [address] [phone]

## 2023-08-17 NOTE — Patient Instructions (Signed)
ApoE4 genotype Amyloid Pet scan Discussed side effect of the medication such as brain bleed, brain swelling. I will contact you to go over the results, otherwise follow-up as scheduled.

## 2023-08-17 NOTE — Telephone Encounter (Signed)
[place] adv NPR sent to [facility] [phone] 

## 2023-08-28 LAB — APOE ALZHEIMER'S RISK

## 2023-09-02 ENCOUNTER — Encounter (HOSPITAL_COMMUNITY)
Admission: RE | Admit: 2023-09-02 | Discharge: 2023-09-02 | Disposition: A | Discharge status: Home / Self Care | Payer: PPO | Source: Ambulatory Visit | Attending: Neurology | Admitting: Neurology

## 2023-09-02 DIAGNOSIS — G3184 Mild cognitive impairment, so stated: Secondary | ICD-10-CM | POA: Diagnosis present

## 2023-09-02 DIAGNOSIS — G309 Alzheimer's disease, unspecified: Secondary | ICD-10-CM | POA: Diagnosis present

## 2023-09-02 MED ORDER — FLORBETAPIR F 18 500-1900 MBQ/ML IV SOLN
9.5000 | Freq: Once | INTRAVENOUS | Status: AC
  Administered 2023-09-02: 9.5 via INTRAVENOUS

## 2023-09-07 NOTE — Progress Notes (Unsigned)
67 y.o. G34P2002 Married Caucasian female here for breast and pelvic exam.  Pt reported left breast pain radiating since Saturday--this has improved.  Patient is followed for hx vaginal atrophy, HSV, and osteopenia.   Sees neurology for monitoring of cognitive function.   PCP: Georgann Housekeeper, MD   Patient's last menstrual period was 10/24/2008.           Sexually active: Yes.    The current method of family planning is status post hysterectomy.    Menopausal hormone therapy:  estrace Exercising: Yes.     walking Smoker:  former  OB History  Gravida Para Term Preterm AB Living  2 2 2     2   SAB IAB Ectopic Multiple Live Births          1    # Outcome Date GA Lbr Len/2nd Weight Sex Type Anes PTL Lv  2 Term 1982   7 lb 8 oz (3.402 kg) M Vag-Spont     1 Term 1980   7 lb 6 oz (3.345 kg) F Vag-Spont   LIV     HEALTH MAINTENANCE: Last 2 paps:  08/19/22 neg: HR HPV neg, 07/18/21 neg: HR HPV neg, 06/28/18 neg, HPV neg. History of abnormal Pap or positive HPV:  yes, CIN II  Mammogram:   03/10/23 Breast Density Cat B, BI-RADS CAT 1 neg Colonoscopy:  many years ago per pt, unsure Bone Density:  02/06/23  Result  osteopenia of hip and spine.  FRAX 10.5%/1.6%.  Immunization History  Administered Date(s) Administered   Influenza Inj Mdck Quad Pf 11/12/2018   Influenza,inj,Quad PF,6+ Mos 07/18/2019   PFIZER(Purple Top)SARS-COV-2 Vaccination 08/21/2020   Tdap 06/09/2017      reports that she quit smoking about 29 years ago. Her smoking use included cigarettes. She has never used smokeless tobacco. She reports that she does not currently use alcohol after a past usage of about 14.0 standard drinks of alcohol per week. She reports that she does not currently use drugs.  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    Buttock   Hypercholesteremia    Hypertension    Osteopenia    STD (sexually transmitted disease) 10/13/1978   hx HSV I of buttocks    Past Surgical History:  Procedure Laterality Date   ABDOMINAL HYSTERECTOMY  2010   APPENDECTOMY     CHOLECYSTECTOMY  10/15/2012   Procedure: LAPAROSCOPIC CHOLECYSTECTOMY WITH INTRAOPERATIVE CHOLANGIOGRAM;  Surgeon: Atilano Ina, MD,FACS;  Location: [facility] OR;  Service: General;  Laterality: N/A;   LAPAROSCOPIC ASSISTED VAGINAL HYSTERECTOMY  1/10   focal CIN II on path   OVARIAN CYST SURGERY  Age 42   TUBAL LIGATION  1985    Current Outpatient Medications  Medication Sig Dispense Refill   citalopram (CELEXA) 20 MG tablet Take 1 tablet (20 mg total) by mouth daily. 30 tablet 6   estradiol (ESTRACE) 0.1 MG/GM vaginal cream Use 1/2 g vaginally every night for the first 2 weeks, then use 1/2 g vaginally two or three times per week as needed to maintain symptom relief. 42.5 g 2   lisinopril-hydrochlorothiazide (PRINZIDE,ZESTORETIC) 10-12.5 MG per tablet Take 1 tablet by mouth daily.     rosuvastatin (CRESTOR) 10 MG tablet Take 10 mg by mouth daily.     valACYclovir (VALTREX)  1000 MG tablet TAKE 1 TABLET BY MOUTH AS NEEDED. TAKE 1 TABLET BY MOUTH ONCE A DAY FOR 5 DAYS AS NEEDED. 30 tablet 1   No current facility-administered medications for this visit.    ALLERGIES: Patient has no known allergies.  Family History  Problem Relation Age of Onset   Heart disease Mother    Diabetes Mother    Hypertension Mother    Cancer Father    Hypertension Sister    Stroke Maternal Grandmother    Hypertension Brother     Review of Systems  All other systems reviewed and are negative.   PHYSICAL EXAM:  BP 126/82 (BP Location: Left Arm, Patient Position: Sitting, Cuff Size: Normal)   Pulse 95   Ht 5' 0.5" (1.537 m)   Wt 138 lb (62.6 kg)   LMP 10/24/2008   SpO2 98%   BMI 26.51 kg/m     General appearance: alert, cooperative and appears stated age Head:  normocephalic, without obvious abnormality, atraumatic Neck: no adenopathy, supple, symmetrical, trachea midline and thyroid normal to inspection and palpation Lungs: clear to auscultation bilaterally Breasts: normal appearance, no masses or tenderness, No nipple retraction or dimpling, No nipple discharge or bleeding, No axillary adenopathy Heart: regular rate and rhythm Abdomen: soft, non-tender; no masses, no organomegaly Extremities: extremities normal, atraumatic, no cyanosis or edema Skin: skin color, texture, turgor normal. No rashes or lesions Lymph nodes: cervical, supraclavicular, and axillary nodes normal. Neurologic: grossly normal  Pelvic: External genitalia:  no lesions              No abnormal inguinal nodes palpated.              Urethra:  normal appearing urethra with no masses, tenderness or lesions              Bartholins and Skenes: normal                 Vagina: normal appearing vagina with normal color and discharge, right vaginal apex with 3 - 4 mm polyp in the hysterectomy scar.               Cervix: absent              Pap taken: Yes.   Bimanual Exam:  Uterus:  absent              Adnexa: no mass, fullness, tenderness              Rectal exam: Yes.  .  Confirms.              Anus:  normal sphincter tone, no lesions  Chaperone was present for exam:  Warren Lacy, CMA  ASSESSMENT: Encounter for breast and pelvic exam. Status post LAVH/BSO.  CIN II on final hysterectomy specimen in 2010.  Vaginal cancer screening. Right vaginal apical lesion.   Hx HSV I.  Hx vaginal atrophy.  Osteopenia.   PLAN: Mammogram screening discussed. Self breast awareness reviewed. Pap and HRV collected:  Yes.   Guidelines for Calcium, Vitamin D, regular exercise program including cardiovascular and weight bearing exercise. Medication refills:  Valtrex and vaginal estradiol cream. I discussed potential effect of estradiol on breast cancer. Follow up:  for colposcopy with vaginal  biopsy and also follow up for annual exam in one year.  Bone density in 2026.  Additional counseling given.  Yes.  . 25 min  total time was spent for this patient encounter, including preparation, face-to-face counseling with the  patient, coordination of care, and documentation of the encounter in addition to doing the breast and pelvic exam.

## 2023-09-14 ENCOUNTER — Telehealth: Payer: Self-pay | Admitting: Neurology

## 2023-09-14 NOTE — Telephone Encounter (Signed)
The patient left a voice mail and it was forwarded to my phone. She said she talked to Dr. Teresa Coombs about a week ago and he was going to order a CFS test? There's nothing in her chart needing to be scheduled that I would handle. Please call her back, thanks.

## 2023-09-15 ENCOUNTER — Other Ambulatory Visit: Payer: Self-pay | Admitting: Neurology

## 2023-09-15 ENCOUNTER — Telehealth: Payer: Self-pay

## 2023-09-15 DIAGNOSIS — G309 Alzheimer's disease, unspecified: Secondary | ICD-10-CM

## 2023-09-15 NOTE — Telephone Encounter (Signed)
PET scan results given to patient and husband by Dr. Teresa Coombs. LP for CSF eval sent to [facility]

## 2023-09-15 NOTE — Telephone Encounter (Signed)
-----   Message from [facility] sent at 09/15/2023 11:17 AM EST ----- LP for CSF analysis sent to [facility] imaging.

## 2023-09-15 NOTE — Progress Notes (Signed)
LP for CSF analysis sent to [facility] imaging.

## 2023-09-15 NOTE — Telephone Encounter (Signed)
Not yet, need to find the right place to send her.

## 2023-09-21 ENCOUNTER — Ambulatory Visit: Payer: PPO | Admitting: Obstetrics and Gynecology

## 2023-09-21 ENCOUNTER — Encounter: Payer: Self-pay | Admitting: Obstetrics and Gynecology

## 2023-09-21 ENCOUNTER — Other Ambulatory Visit (HOSPITAL_COMMUNITY)
Admission: RE | Admit: 2023-09-21 | Discharge: 2023-09-21 | Disposition: A | Discharge status: Home / Self Care | Payer: PPO | Source: Ambulatory Visit | Attending: Obstetrics and Gynecology | Admitting: Obstetrics and Gynecology

## 2023-09-21 VITALS — BP 126/82 | HR 95 | Ht 60.5 in | Wt 138.0 lb

## 2023-09-21 DIAGNOSIS — Z5181 Encounter for therapeutic drug level monitoring: Secondary | ICD-10-CM | POA: Diagnosis not present

## 2023-09-21 DIAGNOSIS — N898 Other specified noninflammatory disorders of vagina: Secondary | ICD-10-CM | POA: Diagnosis not present

## 2023-09-21 DIAGNOSIS — Z1272 Encounter for screening for malignant neoplasm of vagina: Secondary | ICD-10-CM | POA: Insufficient documentation

## 2023-09-21 DIAGNOSIS — B009 Herpesviral infection, unspecified: Secondary | ICD-10-CM

## 2023-09-21 DIAGNOSIS — Z01411 Encounter for gynecological examination (general) (routine) with abnormal findings: Secondary | ICD-10-CM | POA: Insufficient documentation

## 2023-09-21 DIAGNOSIS — Z9189 Other specified personal risk factors, not elsewhere classified: Secondary | ICD-10-CM

## 2023-09-21 DIAGNOSIS — Z87410 Personal history of cervical dysplasia: Secondary | ICD-10-CM | POA: Insufficient documentation

## 2023-09-21 DIAGNOSIS — N952 Postmenopausal atrophic vaginitis: Secondary | ICD-10-CM

## 2023-09-21 DIAGNOSIS — Z01419 Encounter for gynecological examination (general) (routine) without abnormal findings: Secondary | ICD-10-CM

## 2023-09-21 DIAGNOSIS — Z1151 Encounter for screening for human papillomavirus (HPV): Secondary | ICD-10-CM | POA: Insufficient documentation

## 2023-09-21 DIAGNOSIS — M8589 Other specified disorders of bone density and structure, multiple sites: Secondary | ICD-10-CM

## 2023-09-21 MED ORDER — ESTRADIOL 0.1 MG/GM VA CREA: TOPICAL_CREAM | VAGINAL | 1 refills | Status: AC

## 2023-09-21 MED ORDER — VALACYCLOVIR HCL 1 G PO TABS: ORAL_TABLET | ORAL | 1 refills | Status: AC

## 2023-09-21 NOTE — Patient Instructions (Signed)

## 2023-09-22 LAB — CYTOLOGY - PAP
Comment: NEGATIVE
Diagnosis: NEGATIVE
High risk HPV: NEGATIVE

## 2023-09-24 ENCOUNTER — Telehealth: Payer: Self-pay

## 2023-09-28 NOTE — Discharge Instructions (Signed)

## 2023-09-29 ENCOUNTER — Inpatient Hospital Stay
Admission: RE | Admit: 2023-09-29 | Discharge: 2023-09-29 | Disposition: A | Discharge status: Home / Self Care | Payer: PPO | Source: Ambulatory Visit | Attending: Neurology

## 2023-09-29 VITALS — BP 160/86 | HR 70

## 2023-09-29 DIAGNOSIS — G3184 Mild cognitive impairment, so stated: Secondary | ICD-10-CM

## 2023-10-06 ENCOUNTER — Encounter: Payer: Self-pay | Admitting: Neurology

## 2023-10-15 LAB — CSF CELL COUNT WITH DIFFERENTIAL
RBC Count, CSF: 4 {cells}/uL — ABNORMAL HIGH
TOTAL NUCLEATED CELL: 2 {cells}/uL (ref 0–5)

## 2023-10-15 LAB — BETA-AMYLOID 42/40 RATIO,CSF
ABETA 40: 8812 pg/mL
ABETA 42/40 Ratio: 0.14 — ABNORMAL LOW
ABETA 42: 1209 pg/mL

## 2023-10-15 LAB — GLUCOSE, CSF: Glucose, CSF: 58 mg/dL (ref 40–80)

## 2023-10-15 LAB — PROTEIN, CSF: Total Protein, CSF: 40 mg/dL (ref 15–60)

## 2023-10-21 ENCOUNTER — Telehealth: Payer: Self-pay

## 2023-10-21 NOTE — Telephone Encounter (Signed)
 Call from patient about denied coverage of APOE Alzheimer risk lab drawn on 08/17/23. Advised patient to email the letter and staff would look in to it. Patient in agreement.

## 2023-10-21 NOTE — Progress Notes (Signed)
GYNECOLOGY  VISIT   HPI: 69 y.o.   Married  Caucasian female   G2P2002 with Patient's last menstrual period was 10/24/2008.   here for: vaginal colpo--vagisil was used last night.  Noted to have a 3 - 4 mm right vaginal apical polypoid lesion at her annual exam on 09/21/23.  Hx CIN II noted at the time of her hysterectomy.  Pap normal and HR HPV negative.   GYNECOLOGIC HISTORY: Patient's last menstrual period was 10/24/2008. Contraception:  hyst Menopausal hormone therapy:  estrace Last 2 paps: 09/21/23 neg: HR HPV neg, 08/19/22 neg: HR HPV neg, 07/18/21 neg: HR HPV neg History of abnormal Pap or positive HPV:  yes, CIN II Mammogram:    03/10/23 Breast Density Cat B, BI-RADS CAT 1 neg         OB History     Gravida  2   Para  2   Term  2   Preterm      AB      Living  2      SAB      IAB      Ectopic      Multiple      Live Births  1              There are no active problems to display for this patient.   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   Buttock   Hypercholesteremia    Hypertension    Osteopenia    STD (sexually transmitted disease) 10/13/1978   hx HSV I of buttocks    Past Surgical History:  Procedure Laterality Date   ABDOMINAL HYSTERECTOMY  2010   APPENDECTOMY     CHOLECYSTECTOMY  10/15/2012   Procedure: LAPAROSCOPIC CHOLECYSTECTOMY WITH INTRAOPERATIVE CHOLANGIOGRAM;  Surgeon: Atilano Ina, MD,FACS;  Location: [facility] OR;  Service: General;  Laterality: N/A;   LAPAROSCOPIC ASSISTED VAGINAL HYSTERECTOMY  1/10   focal CIN II on path   OVARIAN CYST SURGERY  Age 51   TUBAL LIGATION  1985    Current Outpatient Medications  Medication Sig Dispense Refill   citalopram (CELEXA) 20 MG tablet Take 1 tablet (20 mg total) by mouth daily. 30 tablet 6   estradiol  (ESTRACE) 0.1 MG/GM vaginal cream Use 1/2 g vaginally two or three times per week as needed to maintain symptom relief. 42.5 g 1   gabapentin (NEURONTIN) 100 MG capsule Take 1 capsule (100 mg total) by mouth 3 (three) times daily. 60 capsule 0   lisinopril-hydrochlorothiazide (PRINZIDE,ZESTORETIC) 10-12.5 MG per tablet Take 1 tablet by mouth daily.     rosuvastatin (CRESTOR) 10 MG tablet Take 10 mg by mouth daily.     valACYclovir (VALTREX) 1000 MG tablet TAKE 1 TABLET BY MOUTH AS NEEDED. TAKE 1 TABLET BY MOUTH ONCE A DAY FOR 5 DAYS AS NEEDED. 30 tablet 1   No current facility-administered medications for this visit.     ALLERGIES: Patient has no known allergies.  Family History  Problem Relation Age of Onset   Heart disease Mother    Diabetes Mother    Hypertension Mother    Cancer Father    Hypertension Sister    Stroke Maternal Grandmother    Hypertension Brother     Social History   Socioeconomic History  Marital status: Married    Spouse name: Not on file   Number of children: Not on file   Years of education: Not on file   Highest education level: Not on file  Occupational History   Not on file  Tobacco Use   Smoking status: Former    Current packs/day: 0.00    Types: Cigarettes    Quit date: 09/23/1994    Years since quitting: 29.1   Smokeless tobacco: Never  Vaping Use   Vaping status: Never Used  Substance and Sexual Activity   Alcohol use: Not Currently    Alcohol/week: 14.0 standard drinks of alcohol    Types: 14 Glasses of wine per week    Comment: occasional    Drug use: Not Currently    Comment: once/month   Sexual activity: Yes    Partners: Female    Birth control/protection: Surgical    Comment: LAVH/BSO; >5 partners; 48 yo  Other Topics Concern   Not on file  Social History Narrative   Right handed   Caffeine-2 cups daily   Social Drivers of Corporate investment banker Strain: Not on file  Food Insecurity: Not on file  Transportation  Needs: Not on file  Physical Activity: Not on file  Stress: Not on file  Social Connections: Not on file  Intimate Partner Violence: Not on file    Review of Systems  All other systems reviewed and are negative.   PHYSICAL EXAMINATION:   BP 132/88 (BP Location: Right Arm, Patient Position: Sitting, Cuff Size: Small)   Pulse 85   Ht 5' 0.5" (1.537 m)   Wt 141 lb (64 kg)   LMP 10/24/2008   SpO2 100%   BMI 27.08 kg/m     General appearance: alert, cooperative and appears stated age   Colposcopy of vagina Consent done.  3% acetic acid placed in the vagina.  Cervix absent.  No acetowhite changes noted.  Right vaginal apex with 4 mm polypoid lesion noted.  Tischler used to essentially remove the polypoid lesion.  Tissue to pathology.  Monsel's placed.  No complications.  Minimal EBL.   Chaperone was present for exam:  Warren Lacy, CMA  ASSESSMENT:  Status post LAVH/BSO.  CIN II on final hysterectomy specimen in 2010.  Recent pap normal and negative HR HPV. Right vaginal apical lesion.   PLAN:  Fu biopsy results.  Final plan to follow.

## 2023-10-22 ENCOUNTER — Ambulatory Visit: Payer: PPO | Admitting: Neurology

## 2023-10-22 ENCOUNTER — Telehealth: Payer: Self-pay

## 2023-10-22 ENCOUNTER — Encounter: Payer: Self-pay | Admitting: Neurology

## 2023-10-22 VITALS — BP 140/82 | HR 90 | Ht 62.0 in | Wt 141.0 lb

## 2023-10-22 DIAGNOSIS — G309 Alzheimer's disease, unspecified: Secondary | ICD-10-CM | POA: Diagnosis not present

## 2023-10-22 DIAGNOSIS — G3184 Mild cognitive impairment, so stated: Secondary | ICD-10-CM

## 2023-10-22 MED ORDER — GABAPENTIN 100 MG PO CAPS: 100.0000 mg | ORAL_CAPSULE | Freq: Three times a day (TID) | ORAL | 0 refills | Status: DC

## 2023-10-22 NOTE — Patient Instructions (Addendum)
 Will submit paperwork for approval for Donanemab  Call us for any questions or concerns.  Will start her on low dose Gabapentin for her headaches, as this might also help with her anxiety.  Follow up as scheduled.

## 2023-10-22 NOTE — Progress Notes (Signed)
 [facility]  PATIENT: Carmen Ball DOB: 10-26-1955  REQUESTING CLINICIAN: Ransom Other, MD HISTORY FROM: Patient/Husband  REASON FOR VISIT: Memory loss    HISTORICAL  CHIEF COMPLAINT:  Chief Complaint  Patient presents with   Follow-up    Rm 13 with spouse  Pt is well, here to discuss results and treatment plan.    INTERVAL HISTORY 10/22/2023:  Patient presents today for follow-up, last visit was in November, at that time we obtained her CSF amyloid which came back showing a low amyloid beta 42/40 ratio.  Her ATN was positive for Alzheimer disease biomarker.  Her APO E4 genotype show E3/E4.  She tells me that her symptoms are the same, she is however more stressed and more depressed about her diagnosis and prognosis.  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 Patient had a formal neuropsychological testing completed this week, pending official report to be sent to us .    INTERVAL HISTORY 08/17/2023:  Patient presents with husband to discuss treatment choices. Last visit was in September.  At that time we obtained an ATN profile which was positive for Alzheimer disease biomarker and patient is interested in pursuing the new treatment such as Leqembi and Kisunla .  She reports since finding about her positive biomarker, she is more depressed, crying more easily.   HISTORY OF PRESENT ILLNESS:  This is a 68 year old woman past medical history of hypertension, hyperlipidemia who is presenting with memory problem.  Patient tells me that her memory problem has been going on for the past 6 years and getting worse.  She just cannot remember.  Sometimes she has difficulties remembering what she had for dinner last night.  She reports she had difficulty with specific details.   Husband tells me that patient memory has been getting worse in the past 6 years.  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  Husband tells me that she is forgetful, she is repetitive, asks the same questions over and over.  She still independent in all activities of daily living.  She is still drive denies any recent accident or being lost in familiar places.    TBI:  No past history of TBI Stroke:    no past history of stroke Seizures:   no past history of seizures Sleep:   no history of sleep apnea.  H Mood: Yes, Depression after mother's death  Family history of Dementia: Paternal grandmother  Functional status: independent in all ADLs and IADLs Patient lives with husband. 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 Yes  Sleep: Good    OTHER MEDICAL CONDITIONS: Hypertension, Hyperlipidemia   REVIEW OF SYSTEMS: Full 14 system review of systems performed and negative with exception of: As noted in the HPI   ALLERGIES: No Known Allergies  HOME MEDICATIONS: Outpatient Medications Prior to Visit  Medication Sig Dispense Refill   citalopram  (CELEXA ) 20 MG tablet Take 1 tablet (  20 mg total) by mouth daily. 30 tablet 6   estradiol  (ESTRACE ) 0.1 MG/GM vaginal cream Use 1/2 g vaginally two or three times per week as needed to maintain symptom relief. 42.5 g 1   lisinopril-hydrochlorothiazide (PRINZIDE,ZESTORETIC) 10-12.5 MG per tablet Take 1 tablet by mouth daily.     rosuvastatin (CRESTOR) 10 MG tablet Take 10 mg by mouth daily.     valACYclovir  (VALTREX ) 1000 MG tablet TAKE 1 TABLET BY  MOUTH AS NEEDED. TAKE 1 TABLET BY MOUTH ONCE A DAY FOR 5 DAYS AS NEEDED. 30 tablet 1   No facility-administered medications prior to visit.    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   Hypercholesteremia    Hypertension    Osteopenia    STD (sexually transmitted disease) 10/13/1978   hx HSV I of buttocks    PAST SURGICAL HISTORY: Past Surgical History:  Procedure Laterality Date   ABDOMINAL HYSTERECTOMY  2010   APPENDECTOMY     CHOLECYSTECTOMY  10/15/2012   Procedure: LAPAROSCOPIC CHOLECYSTECTOMY WITH INTRAOPERATIVE CHOLANGIOGRAM;  Surgeon: Camellia CHRISTELLA Blush, MD,FACS;  Location: [facility] OR;  Service: General;  Laterality: N/A;   LAPAROSCOPIC ASSISTED VAGINAL HYSTERECTOMY  1/10   focal CIN II on path   OVARIAN CYST SURGERY  Age 60   TUBAL LIGATION  1985    FAMILY HISTORY: Family History  Problem Relation Age of Onset   Heart disease Mother    Diabetes Mother    Hypertension Mother    Cancer Father    Hypertension Sister    Stroke Maternal Grandmother    Hypertension Brother     SOCIAL HISTORY: Social History   Socioeconomic History   Marital status: Married    Spouse name: Not on file   Number of children: Not on file   Years of education: Not on file   Highest education level: Not on file  Occupational History   Not on file  Tobacco Use   Smoking status: Former    Current packs/day: 0.00    Types: Cigarettes    Quit date: 09/23/1994    Years since quitting: 29.0   Smokeless tobacco: Never  Vaping Use   Vaping status: Never Used  Substance and Sexual Activity   Alcohol use: Not Currently    Alcohol/week: 14.0 standard drinks of alcohol    Types: 14 Glasses of wine per week    Comment: occasional    Drug use: Not Currently    Comment: once/month   Sexual  activity: Yes    Partners: Female    Birth control/protection: Surgical    Comment: LAVH/BSO; >5 partners; 60 yo  Other Topics Concern   Not on file  Social History Narrative   Right handed   Caffeine-2 cups daily   Social Drivers of Corporate Investment Banker Strain: Not on file  Food Insecurity: Not on file  Transportation Needs: Not on file  Physical Activity: Not on file  Stress: Not on file  Social Connections: Not on file  Intimate Partner Violence: Not on file    PHYSICAL EXAM  GENERAL EXAM/CONSTITUTIONAL: Vitals:  Vitals:   10/22/23 0923  BP: (!) 140/82  Pulse: 90  Weight: 141 lb (64 kg)  Height: 5' 2 (1.575 m)    Body mass  index is 25.79 kg/m. Wt Readings from Last 3 Encounters:  10/22/23 141 lb (64 kg)  09/21/23 138 lb (62.6 kg)  08/17/23 141 lb (64 kg)   Patient is in no distress; well developed, nourished and groomed; neck is supple  MUSCULOSKELETAL: Gait, strength, tone, movements noted in Neurologic exam below  NEUROLOGIC: MENTAL STATUS:      No data to display            06/23/2023    9:39 AM  Montreal Cognitive Assessment   Visuospatial/ Executive (0/5) 5  Naming (0/3) 3  Attention: Read list of digits (0/2) 2  Attention: Read list of letters (0/1) 1  Attention: Serial 7 subtraction starting at 100 (0/3) 1  Language: Repeat phrase (0/2) 2  Language : Fluency (0/1) 0  Abstraction (0/2) 2  Delayed Recall (0/5) 0  Orientation (0/6) 4  Total 20    CRANIAL NERVE:  2nd, 3rd, 4th, 6th- visual fields full to confrontation, extraocular muscles intact, no nystagmus 5th - facial sensation symmetric 7th - facial strength symmetric 8th - hearing intact 9th - palate elevates symmetrically, uvula midline 11th - shoulder shrug symmetric 12th - tongue protrusion midline  MOTOR:  normal bulk and tone, full strength in the BUE, BLE  SENSORY:  normal and symmetric to light touch  COORDINATION:  finger-nose-finger, fine finger  movements normal  GAIT/STATION:  normal   DIAGNOSTIC DATA (LABS, IMAGING, TESTING) - I reviewed patient records, labs, notes, testing and imaging myself where available.  Lab Results  Component Value Date   WBC 9.7 10/04/2012   HGB 13.9 10/04/2012   HCT 40.9 10/04/2012   MCV 97.8 10/04/2012   PLT 283 10/04/2012      Component Value Date/Time   NA 136 10/04/2012 1500   K 3.5 10/04/2012 1500   CL 100 10/04/2012 1500   CO2 25 10/04/2012 1500   GLUCOSE 90 10/04/2012 1500   BUN 14 10/04/2012 1500   CREATININE 0.65 10/04/2012 1500   CALCIUM 9.5 10/04/2012 1500   PROT 7.2 10/04/2012 1500   ALBUMIN 3.9 10/04/2012 1500   AST 14 10/04/2012 1500   ALT 10 10/04/2012 1500   ALKPHOS 52 10/04/2012 1500   BILITOT 0.3 10/04/2012 1500   GFRNONAA >90 10/04/2012 1500   GFRAA >90 10/04/2012 1500   No results found for: CHOL, HDL, LDLCALC, LDLDIRECT, TRIG, CHOLHDL No results found for: YHAJ8R Lab Results  Component Value Date   VITAMINB12 623 06/23/2023   Lab Results  Component Value Date   TSH 4.930 (H) 06/23/2023   ATN profile positive for Alzheimer dementia biomarkers.   APOE Genotype E3/E4  CSF Abeta 42/40 ratio low 0.14 (<0.16 increase risk for Alzheimer disease)  Amyloid Pet Scan 10/22/2023 Upon further review and consideration, while there is an underlying white matter pattern, there is loss of the gray-white differentiation within the frontal lobes and temporal lobes. Loss of gray-white differentiation within the frontal lobes and temporal lobes is highly suspicious for pathologic beta amyloid deposition within the cerebral cortex which would be consistent with a diagnosis of Alzheimer's disease pathology   MRI Brain 07/02/2023 - Mild atrophy and mild chronic small vessel ischemic disease.  - No acute findings  ASSESSMENT AND PLAN  68 y.o. year old female with hypertension, hyperlipidemia, diagnosed with mild cognitive impairment who is presenting to discuss  her treatment option.  Her ATN profile was positive for Alzheimer disease biomarker, her CSF analysis showed a low amyloid beta 42/40 ratio, her PET amyloid is  suggestive of amyloid deposition, and her APO E4 genotype showed heterozygous E3/E4.  Patient mild cognitive impairment is likely due to Alzheimer disease.  We discussed treatment including the new monoclonal antibodies and patient with her husband prefer to start with donanemab.  We will submit for approval.  She also had a her formal neuropsychological testing completed this week, pending official report.  I will see her as scheduled but she understands to contact me sooner if she has any new questions or concerns. All other questions answered.    1. 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  Follow up as scheduled.   No orders of the defined types were placed in this encounter.   Meds ordered this encounter  Medications   gabapentin  (NEURONTIN ) 100 MG capsule    Sig: Take 1 capsule (100 mg total) by mouth 3 (three) times daily.    Dispense:  60 capsule    Refill:  0    No follow-ups on file.   Pastor Falling, MD 10/22/2023, 1:02 PM  [facility] [address] [place], [place] [address] [phone]

## 2023-10-29 NOTE — Telephone Encounter (Signed)
Ok for Motrin. Thanks

## 2023-11-03 NOTE — Progress Notes (Signed)
Yes, I talked to him and he addended the report.

## 2023-11-04 ENCOUNTER — Encounter: Payer: Self-pay | Admitting: Obstetrics and Gynecology

## 2023-11-04 ENCOUNTER — Ambulatory Visit: Payer: PPO | Admitting: Obstetrics and Gynecology

## 2023-11-04 ENCOUNTER — Other Ambulatory Visit (HOSPITAL_COMMUNITY)
Admission: RE | Admit: 2023-11-04 | Discharge: 2023-11-04 | Disposition: A | Discharge status: Home / Self Care | Payer: PPO | Source: Ambulatory Visit | Attending: Obstetrics and Gynecology | Admitting: Obstetrics and Gynecology

## 2023-11-04 VITALS — BP 132/88 | HR 85 | Ht 60.5 in | Wt 141.0 lb

## 2023-11-04 DIAGNOSIS — N898 Other specified noninflammatory disorders of vagina: Secondary | ICD-10-CM

## 2023-11-04 DIAGNOSIS — Z87410 Personal history of cervical dysplasia: Secondary | ICD-10-CM

## 2023-11-05 ENCOUNTER — Telehealth: Payer: Self-pay

## 2023-11-05 NOTE — Telephone Encounter (Signed)
Returned call to pt husband who stated that we need [facility] to fax [place] what they need and we would be happy to help

## 2023-11-05 NOTE — Telephone Encounter (Signed)
Call to patient, she is aware that [facility] will need to reach out to health team [employer] per their email back to me (APOE ALZ Riask)

## 2023-11-05 NOTE — Telephone Encounter (Addendum)
Pt's husband,  Neylin Emmerich would like a call back to discuss the insurance denial and confused about the next step. Please call [phone]

## 2023-11-06 LAB — SURGICAL PATHOLOGY

## 2023-11-08 ENCOUNTER — Encounter: Payer: Self-pay | Admitting: Obstetrics and Gynecology

## 2023-11-09 ENCOUNTER — Encounter: Payer: Self-pay | Admitting: Neurology

## 2023-11-11 ENCOUNTER — Encounter: Payer: Self-pay | Admitting: Neurology

## 2023-11-16 ENCOUNTER — Telehealth: Payer: Self-pay

## 2023-11-16 NOTE — Telephone Encounter (Signed)
DR. Teresa Coombs,  PLEASE ORDER MRI ACCORDING TO THE FOLLOWING:

## 2023-11-24 ENCOUNTER — Other Ambulatory Visit: Payer: Self-pay | Admitting: Neurology

## 2023-11-24 DIAGNOSIS — G309 Alzheimer's disease, unspecified: Secondary | ICD-10-CM

## 2023-11-24 NOTE — Telephone Encounter (Signed)
The first 2 MRI as scheduled.

## 2023-11-25 NOTE — Telephone Encounter (Signed)
The orders have been sent to [facility] to schedule. [phone]

## 2023-12-06 ENCOUNTER — Encounter: Payer: Self-pay | Admitting: Neurology

## 2023-12-07 NOTE — Telephone Encounter (Signed)
 Ok to see the chiropractor, minimal neck manipulation

## 2023-12-15 DIAGNOSIS — M5411 Radiculopathy, occipito-atlanto-axial region: Secondary | ICD-10-CM | POA: Diagnosis not present

## 2023-12-15 DIAGNOSIS — M9901 Segmental and somatic dysfunction of cervical region: Secondary | ICD-10-CM | POA: Diagnosis not present

## 2023-12-17 DIAGNOSIS — M5411 Radiculopathy, occipito-atlanto-axial region: Secondary | ICD-10-CM | POA: Diagnosis not present

## 2023-12-17 DIAGNOSIS — M9901 Segmental and somatic dysfunction of cervical region: Secondary | ICD-10-CM | POA: Diagnosis not present

## 2023-12-21 DIAGNOSIS — M9901 Segmental and somatic dysfunction of cervical region: Secondary | ICD-10-CM | POA: Diagnosis not present

## 2023-12-21 DIAGNOSIS — M5411 Radiculopathy, occipito-atlanto-axial region: Secondary | ICD-10-CM | POA: Diagnosis not present

## 2023-12-22 ENCOUNTER — Encounter: Payer: Self-pay | Admitting: Neurology

## 2023-12-23 ENCOUNTER — Ambulatory Visit
Admission: RE | Admit: 2023-12-23 | Discharge: 2023-12-23 | Disposition: A | Discharge status: Home / Self Care | Payer: PPO | Source: Ambulatory Visit | Attending: Neurology

## 2023-12-23 DIAGNOSIS — G309 Alzheimer's disease, unspecified: Secondary | ICD-10-CM | POA: Diagnosis not present

## 2023-12-23 DIAGNOSIS — R413 Other amnesia: Secondary | ICD-10-CM | POA: Diagnosis not present

## 2023-12-24 DIAGNOSIS — M5411 Radiculopathy, occipito-atlanto-axial region: Secondary | ICD-10-CM | POA: Diagnosis not present

## 2023-12-24 DIAGNOSIS — M9901 Segmental and somatic dysfunction of cervical region: Secondary | ICD-10-CM | POA: Diagnosis not present

## 2023-12-28 ENCOUNTER — Encounter: Payer: Self-pay | Admitting: Neurology

## 2023-12-28 DIAGNOSIS — G3184 Mild cognitive impairment, so stated: Secondary | ICD-10-CM | POA: Diagnosis not present

## 2023-12-28 DIAGNOSIS — G309 Alzheimer's disease, unspecified: Secondary | ICD-10-CM | POA: Diagnosis not present

## 2023-12-28 DIAGNOSIS — Z006 Encounter for examination for normal comparison and control in clinical research program: Secondary | ICD-10-CM | POA: Diagnosis not present

## 2023-12-28 DIAGNOSIS — F0280 Dementia in other diseases classified elsewhere without behavioral disturbance: Secondary | ICD-10-CM | POA: Diagnosis not present

## 2023-12-29 DIAGNOSIS — M5411 Radiculopathy, occipito-atlanto-axial region: Secondary | ICD-10-CM | POA: Diagnosis not present

## 2023-12-29 DIAGNOSIS — M9901 Segmental and somatic dysfunction of cervical region: Secondary | ICD-10-CM | POA: Diagnosis not present

## 2023-12-30 DIAGNOSIS — M9901 Segmental and somatic dysfunction of cervical region: Secondary | ICD-10-CM | POA: Diagnosis not present

## 2023-12-30 DIAGNOSIS — M5411 Radiculopathy, occipito-atlanto-axial region: Secondary | ICD-10-CM | POA: Diagnosis not present

## 2024-01-04 DIAGNOSIS — M5411 Radiculopathy, occipito-atlanto-axial region: Secondary | ICD-10-CM | POA: Diagnosis not present

## 2024-01-04 DIAGNOSIS — M9901 Segmental and somatic dysfunction of cervical region: Secondary | ICD-10-CM | POA: Diagnosis not present

## 2024-01-06 DIAGNOSIS — M9901 Segmental and somatic dysfunction of cervical region: Secondary | ICD-10-CM | POA: Diagnosis not present

## 2024-01-06 DIAGNOSIS — M5411 Radiculopathy, occipito-atlanto-axial region: Secondary | ICD-10-CM | POA: Diagnosis not present

## 2024-01-11 DIAGNOSIS — M5411 Radiculopathy, occipito-atlanto-axial region: Secondary | ICD-10-CM | POA: Diagnosis not present

## 2024-01-11 DIAGNOSIS — M9901 Segmental and somatic dysfunction of cervical region: Secondary | ICD-10-CM | POA: Diagnosis not present

## 2024-01-13 DIAGNOSIS — M9901 Segmental and somatic dysfunction of cervical region: Secondary | ICD-10-CM | POA: Diagnosis not present

## 2024-01-13 DIAGNOSIS — M5411 Radiculopathy, occipito-atlanto-axial region: Secondary | ICD-10-CM | POA: Diagnosis not present

## 2024-01-20 DIAGNOSIS — M5411 Radiculopathy, occipito-atlanto-axial region: Secondary | ICD-10-CM | POA: Diagnosis not present

## 2024-01-20 DIAGNOSIS — M9901 Segmental and somatic dysfunction of cervical region: Secondary | ICD-10-CM | POA: Diagnosis not present

## 2024-01-21 ENCOUNTER — Ambulatory Visit
Admission: RE | Admit: 2024-01-21 | Discharge: 2024-01-21 | Disposition: A | Discharge status: Home / Self Care | Payer: PPO | Source: Ambulatory Visit | Attending: Neurology | Admitting: Neurology

## 2024-01-21 ENCOUNTER — Encounter: Payer: Self-pay | Admitting: Neurology

## 2024-01-21 DIAGNOSIS — F0390 Unspecified dementia without behavioral disturbance: Secondary | ICD-10-CM | POA: Diagnosis not present

## 2024-01-21 DIAGNOSIS — G309 Alzheimer's disease, unspecified: Secondary | ICD-10-CM | POA: Diagnosis not present

## 2024-01-25 ENCOUNTER — Other Ambulatory Visit: Payer: Self-pay | Admitting: Neurology

## 2024-01-25 DIAGNOSIS — F0280 Dementia in other diseases classified elsewhere without behavioral disturbance: Secondary | ICD-10-CM | POA: Diagnosis not present

## 2024-01-25 DIAGNOSIS — G309 Alzheimer's disease, unspecified: Secondary | ICD-10-CM

## 2024-01-25 DIAGNOSIS — G3184 Mild cognitive impairment, so stated: Secondary | ICD-10-CM | POA: Diagnosis not present

## 2024-01-25 DIAGNOSIS — Z006 Encounter for examination for normal comparison and control in clinical research program: Secondary | ICD-10-CM | POA: Diagnosis not present

## 2024-01-26 DIAGNOSIS — M5411 Radiculopathy, occipito-atlanto-axial region: Secondary | ICD-10-CM | POA: Diagnosis not present

## 2024-01-26 DIAGNOSIS — M9901 Segmental and somatic dysfunction of cervical region: Secondary | ICD-10-CM | POA: Diagnosis not present

## 2024-01-28 DIAGNOSIS — M9901 Segmental and somatic dysfunction of cervical region: Secondary | ICD-10-CM | POA: Diagnosis not present

## 2024-01-28 DIAGNOSIS — M5411 Radiculopathy, occipito-atlanto-axial region: Secondary | ICD-10-CM | POA: Diagnosis not present

## 2024-02-02 DIAGNOSIS — M5411 Radiculopathy, occipito-atlanto-axial region: Secondary | ICD-10-CM | POA: Diagnosis not present

## 2024-02-02 DIAGNOSIS — M9901 Segmental and somatic dysfunction of cervical region: Secondary | ICD-10-CM | POA: Diagnosis not present

## 2024-02-09 DIAGNOSIS — M9901 Segmental and somatic dysfunction of cervical region: Secondary | ICD-10-CM | POA: Diagnosis not present

## 2024-02-09 DIAGNOSIS — M5411 Radiculopathy, occipito-atlanto-axial region: Secondary | ICD-10-CM | POA: Diagnosis not present

## 2024-02-15 ENCOUNTER — Other Ambulatory Visit: Payer: Self-pay | Admitting: Neurology

## 2024-02-15 MED ORDER — KISUNLA 350 MG/20ML IV SOLN: 10.0000 mg/kg | INTRAVENOUS | Status: AC

## 2024-02-16 ENCOUNTER — Ambulatory Visit
Admission: RE | Admit: 2024-02-16 | Discharge: 2024-02-16 | Disposition: A | Discharge status: Home / Self Care | Source: Ambulatory Visit | Attending: Neurology

## 2024-02-16 DIAGNOSIS — G309 Alzheimer's disease, unspecified: Secondary | ICD-10-CM | POA: Diagnosis not present

## 2024-02-17 DIAGNOSIS — M5411 Radiculopathy, occipito-atlanto-axial region: Secondary | ICD-10-CM | POA: Diagnosis not present

## 2024-02-17 DIAGNOSIS — M9901 Segmental and somatic dysfunction of cervical region: Secondary | ICD-10-CM | POA: Diagnosis not present

## 2024-02-19 ENCOUNTER — Encounter: Payer: Self-pay | Admitting: Neurology

## 2024-02-21 ENCOUNTER — Encounter: Payer: Self-pay | Admitting: Neurology

## 2024-02-21 NOTE — Progress Notes (Signed)
 Please inform Carmen Ball that patient MRI did not show evidence of bleeding or swelling. We can proceed with the next infusion.   Thanks

## 2024-02-24 DIAGNOSIS — Z006 Encounter for examination for normal comparison and control in clinical research program: Secondary | ICD-10-CM | POA: Diagnosis not present

## 2024-02-24 DIAGNOSIS — G3184 Mild cognitive impairment, so stated: Secondary | ICD-10-CM | POA: Diagnosis not present

## 2024-02-24 DIAGNOSIS — G309 Alzheimer's disease, unspecified: Secondary | ICD-10-CM | POA: Diagnosis not present

## 2024-02-24 DIAGNOSIS — M9901 Segmental and somatic dysfunction of cervical region: Secondary | ICD-10-CM | POA: Diagnosis not present

## 2024-02-24 DIAGNOSIS — M5411 Radiculopathy, occipito-atlanto-axial region: Secondary | ICD-10-CM | POA: Diagnosis not present

## 2024-02-24 DIAGNOSIS — F0280 Dementia in other diseases classified elsewhere without behavioral disturbance: Secondary | ICD-10-CM | POA: Diagnosis not present

## 2024-02-29 ENCOUNTER — Telehealth: Payer: Self-pay

## 2024-02-29 ENCOUNTER — Ambulatory Visit: Payer: Self-pay

## 2024-02-29 NOTE — Telephone Encounter (Signed)
 Call to patient, reviewed MRI results and advised ok to proceed with next infusion. Infusion suite made aware.

## 2024-03-02 DIAGNOSIS — M9901 Segmental and somatic dysfunction of cervical region: Secondary | ICD-10-CM | POA: Diagnosis not present

## 2024-03-02 DIAGNOSIS — M5411 Radiculopathy, occipito-atlanto-axial region: Secondary | ICD-10-CM | POA: Diagnosis not present

## 2024-03-09 DIAGNOSIS — M5411 Radiculopathy, occipito-atlanto-axial region: Secondary | ICD-10-CM | POA: Diagnosis not present

## 2024-03-09 DIAGNOSIS — M9901 Segmental and somatic dysfunction of cervical region: Secondary | ICD-10-CM | POA: Diagnosis not present

## 2024-03-11 ENCOUNTER — Other Ambulatory Visit: Payer: Self-pay | Admitting: Neurology

## 2024-03-16 DIAGNOSIS — M9901 Segmental and somatic dysfunction of cervical region: Secondary | ICD-10-CM | POA: Diagnosis not present

## 2024-03-16 DIAGNOSIS — M5411 Radiculopathy, occipito-atlanto-axial region: Secondary | ICD-10-CM | POA: Diagnosis not present

## 2024-03-18 DIAGNOSIS — I10 Essential (primary) hypertension: Secondary | ICD-10-CM | POA: Diagnosis not present

## 2024-03-28 ENCOUNTER — Other Ambulatory Visit: Payer: Self-pay | Admitting: Neurology

## 2024-03-28 DIAGNOSIS — F0280 Dementia in other diseases classified elsewhere without behavioral disturbance: Secondary | ICD-10-CM | POA: Diagnosis not present

## 2024-03-28 DIAGNOSIS — G309 Alzheimer's disease, unspecified: Secondary | ICD-10-CM | POA: Diagnosis not present

## 2024-03-28 DIAGNOSIS — Z006 Encounter for examination for normal comparison and control in clinical research program: Secondary | ICD-10-CM | POA: Diagnosis not present

## 2024-03-28 DIAGNOSIS — G3184 Mild cognitive impairment, so stated: Secondary | ICD-10-CM

## 2024-03-29 DIAGNOSIS — Z1331 Encounter for screening for depression: Secondary | ICD-10-CM | POA: Diagnosis not present

## 2024-03-29 DIAGNOSIS — Z0000 Encounter for general adult medical examination without abnormal findings: Secondary | ICD-10-CM | POA: Diagnosis not present

## 2024-03-29 DIAGNOSIS — I700 Atherosclerosis of aorta: Secondary | ICD-10-CM | POA: Diagnosis not present

## 2024-03-29 DIAGNOSIS — F411 Generalized anxiety disorder: Secondary | ICD-10-CM | POA: Diagnosis not present

## 2024-03-29 DIAGNOSIS — E7800 Pure hypercholesterolemia, unspecified: Secondary | ICD-10-CM | POA: Diagnosis not present

## 2024-03-29 DIAGNOSIS — G309 Alzheimer's disease, unspecified: Secondary | ICD-10-CM | POA: Diagnosis not present

## 2024-03-29 DIAGNOSIS — I10 Essential (primary) hypertension: Secondary | ICD-10-CM | POA: Diagnosis not present

## 2024-03-30 ENCOUNTER — Encounter: Payer: Self-pay | Admitting: Neurology

## 2024-03-30 DIAGNOSIS — M9901 Segmental and somatic dysfunction of cervical region: Secondary | ICD-10-CM | POA: Diagnosis not present

## 2024-03-30 DIAGNOSIS — M5411 Radiculopathy, occipito-atlanto-axial region: Secondary | ICD-10-CM | POA: Diagnosis not present

## 2024-04-03 ENCOUNTER — Other Ambulatory Visit

## 2024-04-06 DIAGNOSIS — M9901 Segmental and somatic dysfunction of cervical region: Secondary | ICD-10-CM | POA: Diagnosis not present

## 2024-04-06 DIAGNOSIS — M5411 Radiculopathy, occipito-atlanto-axial region: Secondary | ICD-10-CM | POA: Diagnosis not present

## 2024-04-11 ENCOUNTER — Ambulatory Visit: Admitting: Neurology

## 2024-04-11 ENCOUNTER — Encounter: Payer: Self-pay | Admitting: Neurology

## 2024-04-11 VITALS — BP 136/82 | Ht 62.0 in | Wt 143.4 lb

## 2024-04-11 DIAGNOSIS — G309 Alzheimer's disease, unspecified: Secondary | ICD-10-CM

## 2024-04-11 DIAGNOSIS — G3184 Mild cognitive impairment, so stated: Secondary | ICD-10-CM

## 2024-04-11 DIAGNOSIS — I10 Essential (primary) hypertension: Secondary | ICD-10-CM | POA: Diagnosis not present

## 2024-04-11 DIAGNOSIS — E7800 Pure hypercholesterolemia, unspecified: Secondary | ICD-10-CM | POA: Diagnosis not present

## 2024-04-11 NOTE — Progress Notes (Signed)
 [facility]  PATIENT: Carmen Ball DOB: 02-02-1956  REQUESTING CLINICIAN: Ransom Other, MD HISTORY FROM: Patient/Husband  REASON FOR VISIT: Memory loss    HISTORICAL  CHIEF COMPLAINT:  Chief Complaint  Patient presents with   Follow-up    Rm 16, follow up for [place],   INTERVAL HISTORY 04/11/2024 Carmen Ball presents today for follow-up, she is accompanied by her husband.  Last visit was in January, since then she continues to do well.  She is compliant with her Kisunla  monthly injection, no side effect from the infusion.  Husband tells me overall her memory is stable, on occasion he has noted sundowning but otherwise no other concerns.  Patient tells me that she is doing well, no additional questions or concerns at the moment.   INTERVAL HISTORY 10/22/2023:  Patient presents today for follow-up, last visit was in November, at that time we obtained her CSF amyloid which came back showing a low amyloid beta 42/40 ratio.  Her ATN was positive for Alzheimer disease biomarker.  Her APO E4 genotype show E3/E4.  She tells me that her symptoms are the same, she is however more stressed and more depressed about her diagnosis and prognosis.  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 Patient had a formal neuropsychological testing completed this week, pending official report to be sent to us .    INTERVAL HISTORY 08/17/2023:  Patient presents with husband to discuss treatment choices. Last visit was in September.  At that time we obtained an ATN profile which was positive for Alzheimer disease biomarker and patient is interested in pursuing the new treatment such as Leqembi and Kisunla .  She reports since finding about her positive biomarker, she is more depressed, crying more easily.   HISTORY OF PRESENT ILLNESS:  This is a 68 year old woman past medical history of hypertension,  hyperlipidemia who is presenting with memory problem.  Patient tells me that her memory problem has been going on for the past 6 years and getting worse.  She just cannot remember.  Sometimes she has difficulties remembering what she had for dinner last night.  She reports she had difficulty with specific details.   Husband tells me that patient memory has been getting worse in the past 6 years.  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  Husband tells me that she is forgetful, she is repetitive, asks the same questions over and over.  She still independent in all activities of daily living.  She is still drive denies any recent accident or being lost in familiar places.    TBI:  No past history of TBI Stroke:    no past history of stroke Seizures:   no past history of seizures Sleep:   no history of sleep apnea.  H Mood: Yes, Depression after mother's death  Family history of Dementia: Paternal grandmother  Functional status: independent in all ADLs and IADLs Patient lives with husband. Cooking: no issues  Cleaning: no issues  Shopping: no issues  Bathing: no issues  Toileting: no issues  Driving: no issues  Bills: Husband took over the business bills but patient still pays household bills  Medications:  Ever left the stove on by accident?: Denies  Forget how to use items around the house?: Denies  Getting lost going to familiar places?: Denies  Forgetting  loved ones names?: Denies  Word finding difficulty? Yes  Sleep: Good    OTHER MEDICAL CONDITIONS: Hypertension, Hyperlipidemia   REVIEW OF SYSTEMS: Full 14 system review of systems performed and negative with exception of: As noted in the HPI   ALLERGIES: No Known Allergies  HOME MEDICATIONS: Outpatient Medications Prior to Visit  Medication Sig Dispense Refill   citalopram  (CELEXA ) 20 MG tablet  TAKE 1 TABLET BY MOUTH EVERY DAY 30 tablet 6   Donanemab-azbt  (KISUNLA ) 350 MG/20ML SOLN Inject 10 mg/kg into the vein every 30 (thirty) days.     estradiol  (ESTRACE ) 0.1 MG/GM vaginal cream Use 1/2 g vaginally two or three times per week as needed to maintain symptom relief. 42.5 g 1   lisinopril-hydrochlorothiazide (PRINZIDE,ZESTORETIC) 10-12.5 MG per tablet Take 1 tablet by mouth daily.     rosuvastatin (CRESTOR) 10 MG tablet Take 10 mg by mouth daily.     valACYclovir  (VALTREX ) 1000 MG tablet TAKE 1 TABLET BY MOUTH AS NEEDED. TAKE 1 TABLET BY MOUTH ONCE A DAY FOR 5 DAYS AS NEEDED. 30 tablet 1   gabapentin  (NEURONTIN ) 100 MG capsule Take 1 capsule (100 mg total) by mouth 3 (three) times daily. (Patient not taking: Reported on 04/11/2024) 60 capsule 0   No facility-administered medications prior to visit.    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   Hypercholesteremia    Hypertension    Osteopenia    STD (sexually transmitted disease) 10/13/1978   hx HSV I of buttocks    PAST SURGICAL HISTORY: Past Surgical History:  Procedure Laterality Date   ABDOMINAL HYSTERECTOMY  2010   APPENDECTOMY     CHOLECYSTECTOMY  10/15/2012   Procedure: LAPAROSCOPIC CHOLECYSTECTOMY WITH INTRAOPERATIVE CHOLANGIOGRAM;  Surgeon: Camellia CHRISTELLA Blush, MD,FACS;  Location: [facility] OR;  Service: General;  Laterality: N/A;   LAPAROSCOPIC ASSISTED VAGINAL HYSTERECTOMY  1/10   focal CIN II on path   OVARIAN CYST SURGERY  Age 74   TUBAL LIGATION  1985    FAMILY HISTORY: Family History  Problem Relation Age of Onset   Heart disease Mother    Diabetes Mother    Hypertension Mother    Cancer Father    Hypertension Sister    Stroke Maternal Grandmother    Hypertension Brother     SOCIAL HISTORY: Social History    Socioeconomic History   Marital status: Married    Spouse name: Not on file   Number of children: Not on file   Years of education: Not on file   Highest education level: Not on file  Occupational History   Not on file  Tobacco Use   Smoking status: Former    Current packs/day: 0.00    Types: Cigarettes    Quit date: 09/23/1994    Years since quitting: 29.5   Smokeless tobacco: Never  Vaping Use   Vaping status: Never Used  Substance and Sexual Activity   Alcohol use: Not Currently    Alcohol/week: 14.0 standard drinks of alcohol    Types: 14 Glasses of wine per week    Comment: occasional    Drug use: Not Currently    Comment: once/month   Sexual activity: Yes    Partners: Female    Birth control/protection: Surgical    Comment:  LAVH/BSO; >5 partners; 39 yo  Other Topics Concern   Not on file  Social History Narrative   Right handed   Caffeine-2 cups daily   Social Drivers of Corporate investment banker Strain: Not on file  Food Insecurity: Not on file  Transportation Needs: Not on file  Physical Activity: Not on file  Stress: Not on file  Social Connections: Not on file  Intimate Partner Violence: Not on file    PHYSICAL EXAM  GENERAL EXAM/CONSTITUTIONAL: Vitals:  Vitals:   04/11/24 1135  BP: 136/82  Weight: 143 lb 6.4 oz (65 kg)  Height: 5' 2 (1.575 m)    Body mass index is 26.23 kg/m. Wt Readings from Last 3 Encounters:  04/11/24 143 lb 6.4 oz (65 kg)  11/04/23 141 lb (64 kg)  10/22/23 141 lb (64 kg)   Patient is in no distress; well developed, nourished and groomed; neck is supple  MUSCULOSKELETAL: Gait, strength, tone, movements noted in Neurologic exam below  NEUROLOGIC: MENTAL STATUS:      No data to display            04/11/2024   11:36 AM 06/23/2023    9:39 AM  Montreal Cognitive Assessment   Visuospatial/ Executive (0/5) 5 5  Naming (0/3) 3 3  Attention: Read list of digits (0/2) 2 2  Attention: Read list of letters  (0/1) 0 1  Attention: Serial 7 subtraction starting at 100 (0/3) 2 1  Language: Repeat phrase (0/2) 2 2  Language : Fluency (0/1) 1 0  Abstraction (0/2) 2 2  Delayed Recall (0/5) 0 0  Orientation (0/6) 5 4  Total 22 20    CRANIAL NERVE:  2nd, 3rd, 4th, 6th- visual fields full to confrontation, extraocular muscles intact, no nystagmus 5th - facial sensation symmetric 7th - facial strength symmetric 8th - hearing intact 9th - palate elevates symmetrically, uvula midline 11th - shoulder shrug symmetric 12th - tongue protrusion midline  MOTOR:  normal bulk and tone, full strength in the BUE, BLE  SENSORY:  normal and symmetric to light touch  COORDINATION:  finger-nose-finger, fine finger movements normal  GAIT/STATION:  normal   DIAGNOSTIC DATA (LABS, IMAGING, TESTING) - I reviewed patient records, labs, notes, testing and imaging myself where available.  Lab Results  Component Value Date   WBC 9.7 10/04/2012   HGB 13.9 10/04/2012   HCT 40.9 10/04/2012   MCV 97.8 10/04/2012   PLT 283 10/04/2012      Component Value Date/Time   NA 136 10/04/2012 1500   K 3.5 10/04/2012 1500   CL 100 10/04/2012 1500   CO2 25 10/04/2012 1500   GLUCOSE 90 10/04/2012 1500   BUN 14 10/04/2012 1500   CREATININE 0.65 10/04/2012 1500   CALCIUM 9.5 10/04/2012 1500   PROT 7.2 10/04/2012 1500   ALBUMIN 3.9 10/04/2012 1500   AST 14 10/04/2012 1500   ALT 10 10/04/2012 1500   ALKPHOS 52 10/04/2012 1500   BILITOT 0.3 10/04/2012 1500   GFRNONAA >90 10/04/2012 1500   GFRAA >90 10/04/2012 1500   No results found for: CHOL, HDL, LDLCALC, LDLDIRECT, TRIG, CHOLHDL No results found for: YHAJ8R Lab Results  Component Value Date   VITAMINB12 623 06/23/2023   Lab Results  Component Value Date   TSH 4.930 (H) 06/23/2023   ATN profile positive for Alzheimer dementia biomarkers.   APOE Genotype E3/E4  CSF Abeta 42/40 ratio low 0.14 (<0.16 increase risk for Alzheimer  disease)  Amyloid Pet Scan  10/22/2023 Upon further review and consideration, while there is an underlying white matter pattern, there is loss of the gray-white differentiation within the frontal lobes and temporal lobes. Loss of gray-white differentiation within the frontal lobes and temporal lobes is highly suspicious for pathologic beta amyloid deposition within the cerebral cortex which would be consistent with a diagnosis of Alzheimer's disease pathology   MRI Brain 07/02/2023 - Mild atrophy and mild chronic small vessel ischemic disease.  - No acute findings  ASSESSMENT AND PLAN  68 y.o. year old female with hypertension, hyperlipidemia, mild cognitive impairment due to Alzheimer's disease who is presenting for follow-up.  She has started her Kisunla  infusion, so far no adverse reaction and MRI has not shown any amyloid related imaging abnormality, edema or hemorrhage.  Husband tells me that overall she is stable even though he has noted some sundowning but nothing too concerning.  Plan for patient will be to continue with her Kisunla  infusion and I will see her in 6 months for follow-up.  Will continue with surveillance MRIs. Return sooner if worse.    1. Mild cognitive impairment (MCI) due to Alzheimer's disease [facility])     Patient Instructions  Continue current medications Will continue Kisunla  infusion schedule Will continue with surveillance MRIs. I will see in 6 months of for follow-up or sooner if worse.  No orders of the defined types were placed in this encounter.   No orders of the defined types were placed in this encounter.   Return in about 5 months (around 09/13/2024).   Pastor Falling, MD 04/11/2024, 3:21 PM  [facility] [address], [address] [place], [place] [address] [phone]

## 2024-04-11 NOTE — Patient Instructions (Signed)
 Continue current medications Will continue Kisunla  infusion schedule Will continue with surveillance MRIs. I will see in 6 months of for follow-up or sooner if worse.

## 2024-04-13 DIAGNOSIS — M5411 Radiculopathy, occipito-atlanto-axial region: Secondary | ICD-10-CM | POA: Diagnosis not present

## 2024-04-13 DIAGNOSIS — M9901 Segmental and somatic dysfunction of cervical region: Secondary | ICD-10-CM | POA: Diagnosis not present

## 2024-04-16 DIAGNOSIS — I10 Essential (primary) hypertension: Secondary | ICD-10-CM | POA: Diagnosis not present

## 2024-04-19 ENCOUNTER — Ambulatory Visit
Admission: RE | Admit: 2024-04-19 | Discharge: 2024-04-19 | Disposition: A | Discharge status: Home / Self Care | Source: Ambulatory Visit | Attending: Neurology

## 2024-04-19 DIAGNOSIS — G3184 Mild cognitive impairment, so stated: Secondary | ICD-10-CM | POA: Diagnosis not present

## 2024-04-19 DIAGNOSIS — G309 Alzheimer's disease, unspecified: Secondary | ICD-10-CM | POA: Diagnosis not present

## 2024-04-20 DIAGNOSIS — M5411 Radiculopathy, occipito-atlanto-axial region: Secondary | ICD-10-CM | POA: Diagnosis not present

## 2024-04-20 DIAGNOSIS — M9901 Segmental and somatic dysfunction of cervical region: Secondary | ICD-10-CM | POA: Diagnosis not present

## 2024-04-21 ENCOUNTER — Telehealth: Payer: Self-pay

## 2024-04-21 ENCOUNTER — Ambulatory Visit: Payer: Self-pay | Admitting: Neurology

## 2024-04-25 ENCOUNTER — Encounter: Payer: Self-pay | Admitting: Neurology

## 2024-04-27 DIAGNOSIS — M9901 Segmental and somatic dysfunction of cervical region: Secondary | ICD-10-CM | POA: Diagnosis not present

## 2024-04-27 DIAGNOSIS — M5411 Radiculopathy, occipito-atlanto-axial region: Secondary | ICD-10-CM | POA: Diagnosis not present

## 2024-05-11 ENCOUNTER — Encounter: Payer: Self-pay | Admitting: Neurology

## 2024-05-12 DIAGNOSIS — E7800 Pure hypercholesterolemia, unspecified: Secondary | ICD-10-CM | POA: Diagnosis not present

## 2024-05-12 DIAGNOSIS — I10 Essential (primary) hypertension: Secondary | ICD-10-CM | POA: Diagnosis not present

## 2024-05-16 DIAGNOSIS — I10 Essential (primary) hypertension: Secondary | ICD-10-CM | POA: Diagnosis not present

## 2024-05-16 DIAGNOSIS — M5411 Radiculopathy, occipito-atlanto-axial region: Secondary | ICD-10-CM | POA: Diagnosis not present

## 2024-05-16 DIAGNOSIS — M9901 Segmental and somatic dysfunction of cervical region: Secondary | ICD-10-CM | POA: Diagnosis not present

## 2024-05-25 ENCOUNTER — Other Ambulatory Visit: Payer: Self-pay | Admitting: Neurology

## 2024-05-25 DIAGNOSIS — Z006 Encounter for examination for normal comparison and control in clinical research program: Secondary | ICD-10-CM | POA: Diagnosis not present

## 2024-05-25 DIAGNOSIS — G3184 Mild cognitive impairment, so stated: Secondary | ICD-10-CM

## 2024-05-25 DIAGNOSIS — G309 Alzheimer's disease, unspecified: Secondary | ICD-10-CM

## 2024-05-25 DIAGNOSIS — F0280 Dementia in other diseases classified elsewhere without behavioral disturbance: Secondary | ICD-10-CM | POA: Diagnosis not present

## 2024-05-31 DIAGNOSIS — M5411 Radiculopathy, occipito-atlanto-axial region: Secondary | ICD-10-CM | POA: Diagnosis not present

## 2024-05-31 DIAGNOSIS — M9901 Segmental and somatic dysfunction of cervical region: Secondary | ICD-10-CM | POA: Diagnosis not present

## 2024-06-12 DIAGNOSIS — I10 Essential (primary) hypertension: Secondary | ICD-10-CM | POA: Diagnosis not present

## 2024-06-12 DIAGNOSIS — E7800 Pure hypercholesterolemia, unspecified: Secondary | ICD-10-CM | POA: Diagnosis not present

## 2024-06-14 ENCOUNTER — Ambulatory Visit
Admission: RE | Admit: 2024-06-14 | Discharge: 2024-06-14 | Disposition: A | Discharge status: Home / Self Care | Source: Ambulatory Visit | Attending: Neurology

## 2024-06-14 DIAGNOSIS — G3184 Mild cognitive impairment, so stated: Secondary | ICD-10-CM | POA: Diagnosis not present

## 2024-06-14 DIAGNOSIS — G309 Alzheimer's disease, unspecified: Secondary | ICD-10-CM

## 2024-06-15 DIAGNOSIS — I10 Essential (primary) hypertension: Secondary | ICD-10-CM | POA: Diagnosis not present

## 2024-06-15 DIAGNOSIS — M5411 Radiculopathy, occipito-atlanto-axial region: Secondary | ICD-10-CM | POA: Diagnosis not present

## 2024-06-15 DIAGNOSIS — M9901 Segmental and somatic dysfunction of cervical region: Secondary | ICD-10-CM | POA: Diagnosis not present

## 2024-06-16 ENCOUNTER — Ambulatory Visit: Payer: Self-pay | Admitting: Neurology

## 2024-06-22 ENCOUNTER — Ambulatory Visit: Payer: PPO | Admitting: Neurology

## 2024-06-23 DIAGNOSIS — G309 Alzheimer's disease, unspecified: Secondary | ICD-10-CM | POA: Diagnosis not present

## 2024-06-23 DIAGNOSIS — F0280 Dementia in other diseases classified elsewhere without behavioral disturbance: Secondary | ICD-10-CM | POA: Diagnosis not present

## 2024-06-23 DIAGNOSIS — Z006 Encounter for examination for normal comparison and control in clinical research program: Secondary | ICD-10-CM | POA: Diagnosis not present

## 2024-06-23 DIAGNOSIS — G3184 Mild cognitive impairment, so stated: Secondary | ICD-10-CM | POA: Diagnosis not present

## 2024-06-28 DIAGNOSIS — M9901 Segmental and somatic dysfunction of cervical region: Secondary | ICD-10-CM | POA: Diagnosis not present

## 2024-06-28 DIAGNOSIS — M5411 Radiculopathy, occipito-atlanto-axial region: Secondary | ICD-10-CM | POA: Diagnosis not present

## 2024-07-12 DIAGNOSIS — I10 Essential (primary) hypertension: Secondary | ICD-10-CM | POA: Diagnosis not present

## 2024-07-12 DIAGNOSIS — E7800 Pure hypercholesterolemia, unspecified: Secondary | ICD-10-CM | POA: Diagnosis not present

## 2024-07-13 DIAGNOSIS — E7800 Pure hypercholesterolemia, unspecified: Secondary | ICD-10-CM | POA: Diagnosis not present

## 2024-07-13 DIAGNOSIS — I10 Essential (primary) hypertension: Secondary | ICD-10-CM | POA: Diagnosis not present

## 2024-07-13 DIAGNOSIS — F411 Generalized anxiety disorder: Secondary | ICD-10-CM | POA: Diagnosis not present

## 2024-07-13 DIAGNOSIS — F5101 Primary insomnia: Secondary | ICD-10-CM | POA: Diagnosis not present

## 2024-07-13 DIAGNOSIS — I700 Atherosclerosis of aorta: Secondary | ICD-10-CM | POA: Diagnosis not present

## 2024-07-13 DIAGNOSIS — G309 Alzheimer's disease, unspecified: Secondary | ICD-10-CM | POA: Diagnosis not present

## 2024-07-15 DIAGNOSIS — I10 Essential (primary) hypertension: Secondary | ICD-10-CM | POA: Diagnosis not present

## 2024-07-19 DIAGNOSIS — M5411 Radiculopathy, occipito-atlanto-axial region: Secondary | ICD-10-CM | POA: Diagnosis not present

## 2024-07-19 DIAGNOSIS — M9901 Segmental and somatic dysfunction of cervical region: Secondary | ICD-10-CM | POA: Diagnosis not present

## 2024-07-21 DIAGNOSIS — F0280 Dementia in other diseases classified elsewhere without behavioral disturbance: Secondary | ICD-10-CM | POA: Diagnosis not present

## 2024-07-21 DIAGNOSIS — G309 Alzheimer's disease, unspecified: Secondary | ICD-10-CM | POA: Diagnosis not present

## 2024-07-21 DIAGNOSIS — G3184 Mild cognitive impairment, so stated: Secondary | ICD-10-CM | POA: Diagnosis not present

## 2024-07-21 DIAGNOSIS — Z006 Encounter for examination for normal comparison and control in clinical research program: Secondary | ICD-10-CM | POA: Diagnosis not present

## 2024-08-09 DIAGNOSIS — M9901 Segmental and somatic dysfunction of cervical region: Secondary | ICD-10-CM | POA: Diagnosis not present

## 2024-08-09 DIAGNOSIS — M5411 Radiculopathy, occipito-atlanto-axial region: Secondary | ICD-10-CM | POA: Diagnosis not present

## 2024-08-12 DIAGNOSIS — I10 Essential (primary) hypertension: Secondary | ICD-10-CM | POA: Diagnosis not present

## 2024-08-12 DIAGNOSIS — E7800 Pure hypercholesterolemia, unspecified: Secondary | ICD-10-CM | POA: Diagnosis not present

## 2024-08-14 DIAGNOSIS — I10 Essential (primary) hypertension: Secondary | ICD-10-CM | POA: Diagnosis not present

## 2024-08-25 DIAGNOSIS — G309 Alzheimer's disease, unspecified: Secondary | ICD-10-CM | POA: Diagnosis not present

## 2024-08-25 DIAGNOSIS — F0280 Dementia in other diseases classified elsewhere without behavioral disturbance: Secondary | ICD-10-CM | POA: Diagnosis not present

## 2024-08-25 DIAGNOSIS — G3184 Mild cognitive impairment, so stated: Secondary | ICD-10-CM | POA: Diagnosis not present

## 2024-08-25 DIAGNOSIS — Z006 Encounter for examination for normal comparison and control in clinical research program: Secondary | ICD-10-CM | POA: Diagnosis not present

## 2024-09-13 ENCOUNTER — Ambulatory Visit: Admitting: Neurology

## 2024-09-13 ENCOUNTER — Encounter: Payer: Self-pay | Admitting: Neurology

## 2024-09-13 VITALS — BP 120/83 | HR 88 | Ht 62.0 in | Wt 141.0 lb

## 2024-09-13 DIAGNOSIS — G309 Alzheimer's disease, unspecified: Secondary | ICD-10-CM | POA: Diagnosis not present

## 2024-09-13 DIAGNOSIS — G3184 Mild cognitive impairment, so stated: Secondary | ICD-10-CM | POA: Diagnosis not present

## 2024-09-13 MED ORDER — CITALOPRAM HYDROBROMIDE 20 MG PO TABS: 20.0000 mg | ORAL_TABLET | Freq: Every day | ORAL | 4 refills | Status: AC

## 2024-09-13 NOTE — Patient Instructions (Signed)
 Continue current  Kisunla  infusion Continue your other medications Continue to follow with your doctors Return in 6 months or sooner if worse

## 2024-09-13 NOTE — Progress Notes (Signed)
 [facility]  PATIENT: Carmen Ball DOB: 09-May-1956  REQUESTING CLINICIAN: Ransom Other, MD HISTORY FROM: Patient/Husband  REASON FOR VISIT: Memory loss    HISTORICAL  CHIEF COMPLAINT:  Chief Complaint  Patient presents with   Memory Loss    Rm 13 MOCA    INTERVAL HISTORY 09/13/2024 Patient presents today for follow-up, she is accompanied by her husband.  Last visit was in [place].  Since then she tells me that she feels about the same and sometimes she feels like her memory is worse.  She also does report some anxiety related to her memory loss. Husband tells me that her memory is stable, She is still independent in her ADLs, needs some help from her husband sometimes but overall she is stable.  She is tolerating her infusion very well, denies any side effect.  Her 10th infusion is scheduled for tomorrow   INTERVAL HISTORY 04/11/2024 [name] presents today for follow-up, she is accompanied by her husband.  Last visit was in January, since then she continues to do well.  She is compliant with her Kisunla  monthly injection, no side effect from the infusion.  Husband tells me overall her memory is stable, on occasion he has noted sundowning but otherwise no Ball concerns.  Patient tells me that she is doing well, no additional questions or concerns at the moment.   INTERVAL HISTORY 10/22/2023:  Patient presents today for follow-up, last visit was in November, at that time we obtained her CSF amyloid which came back showing a low amyloid beta 42/40 ratio.  Her ATN was positive for Alzheimer disease biomarker.  Her APO E4 genotype show E3/E4.  She tells me that her symptoms are the same, she is however more stressed and more depressed about her diagnosis and prognosis.  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 Patient had a formal neuropsychological testing completed this week, pending  official report to be sent to us .    INTERVAL HISTORY 08/17/2023:  Patient presents with husband to discuss treatment choices. Last visit was in September.  At that time we obtained an ATN profile which was positive for Alzheimer disease biomarker and patient is interested in pursuing the new treatment such as Leqembi and Kisunla .  She reports since finding about her positive biomarker, she is more depressed, crying more easily.   HISTORY OF PRESENT ILLNESS:  This is a 68 year old woman past medical history of hypertension, hyperlipidemia who is presenting with memory problem.  Patient tells me that her memory problem has been going on for the past 6 years and getting worse.  She just cannot remember.  Sometimes she has difficulties remembering what she had for dinner last night.  She reports she had difficulty with specific details.   Husband tells me that patient memory has been getting worse in the past 6 years.  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  Husband tells me that she is forgetful, she is repetitive, asks the same questions over and over.  She still independent in all activities of daily living.  She is still drive denies any recent accident or being lost in familiar places.    TBI:  No past history of TBI Stroke:    no past history of stroke Seizures:   no past history of seizures Sleep:  no history of sleep apnea.  H Mood: Yes, Depression after mother's death  Family history of Dementia: Paternal grandmother  Functional status: independent in all ADLs and IADLs Patient lives with husband. Cooking: no issues  Cleaning: no issues  Shopping: no issues  Bathing: no issues  Toileting: no issues  Driving: no issues  Bills: Husband took over the business bills but patient still pays household bills  Medications:  Ever left the stove on by accident?:  Denies  Forget how to use items around the house?: Denies  Getting lost going to familiar places?: Denies  Forgetting loved ones names?: Denies  Word finding difficulty? Yes  Sleep: Good    Ball MEDICAL CONDITIONS: Hypertension, Hyperlipidemia   REVIEW OF SYSTEMS: Full 14 system review of systems performed and negative with exception of: As noted in the HPI   ALLERGIES: No Known Allergies  HOME MEDICATIONS: Outpatient Medications Prior to Visit  Medication Sig Dispense Refill   Donanemab-azbt  (KISUNLA ) 350 MG/20ML SOLN Inject 10 mg/kg into the vein every 30 (thirty) days.     estradiol  (ESTRACE ) 0.1 MG/GM vaginal cream Use 1/2 g vaginally two or three times per week as needed to maintain symptom relief. 42.5 g 1   lisinopril-hydrochlorothiazide (PRINZIDE,ZESTORETIC) 10-12.5 MG per tablet Take 1 tablet by mouth daily.     rosuvastatin (CRESTOR) 10 MG tablet Take 10 mg by mouth daily.     valACYclovir  (VALTREX ) 1000 MG tablet TAKE 1 TABLET BY MOUTH AS NEEDED. TAKE 1 TABLET BY MOUTH ONCE A DAY FOR 5 DAYS AS NEEDED. 30 tablet 1   citalopram  (CELEXA ) 20 MG tablet TAKE 1 TABLET BY MOUTH EVERY DAY 30 tablet 6   gabapentin  (NEURONTIN ) 100 MG capsule Take 1 capsule (100 mg total) by mouth 3 (three) times daily. (Patient not taking: Reported on 04/11/2024) 60 capsule 0   No facility-administered medications prior to visit.    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   Hypercholesteremia    Hypertension    Osteopenia    STD (sexually transmitted disease) 10/13/1978   hx HSV I of buttocks    PAST SURGICAL HISTORY: Past Surgical History:  Procedure Laterality Date   ABDOMINAL HYSTERECTOMY  2010   APPENDECTOMY     CHOLECYSTECTOMY  10/15/2012   Procedure: LAPAROSCOPIC  CHOLECYSTECTOMY WITH INTRAOPERATIVE CHOLANGIOGRAM;  Surgeon: Carmen CHRISTELLA Blush, MD,FACS;  Location: [facility] OR;  Service: General;  Laterality: N/A;   LAPAROSCOPIC ASSISTED VAGINAL HYSTERECTOMY  1/10   focal CIN II on path   OVARIAN CYST SURGERY  Age 60   TUBAL LIGATION  1985    FAMILY HISTORY: Family History  Problem Relation Age of Onset   Heart disease Mother    Diabetes Mother    Hypertension Mother    Cancer Father    Hypertension Sister    Stroke Maternal Grandmother    Hypertension Brother     SOCIAL HISTORY: Social History   Socioeconomic History   Marital status: Married    Spouse name: Not on file   Number of children: Not on file   Years of education: Not on file   Highest education level: Not on file  Occupational History   Not on file  Tobacco Use   Smoking status: Former    Current  packs/day: 0.00    Types: Cigarettes    Quit date: 09/23/1994    Years since quitting: 29.9   Smokeless tobacco: Never  Vaping Use   Vaping status: Never Used  Substance and Sexual Activity   Alcohol use: Not Currently    Alcohol/week: 14.0 standard drinks of alcohol    Types: 14 Glasses of wine per week    Comment: occasional    Drug use: Not Currently    Comment: once/month   Sexual activity: Yes    Partners: Female    Birth control/protection: Surgical    Comment: LAVH/BSO; >5 partners; 61 yo  Ball Topics Concern   Not on file  Social History Narrative   Right handed   Caffeine-2 cups daily   Social Drivers of Corporate Investment Banker Strain: Not on file  Food Insecurity: Not on file  Transportation Needs: Not on file  Physical Activity: Not on file  Stress: Not on file  Social Connections: Not on file  Intimate Partner Violence: Not on file    PHYSICAL EXAM  GENERAL EXAM/CONSTITUTIONAL: Vitals:  Vitals:   09/13/24 1334  BP: 120/83  Pulse: 88  SpO2: 94%  Weight: 141 lb (64 kg)  Height: 5' 2 (1.575 m)    Body mass index is 25.79 kg/m. Wt Readings  from Last 3 Encounters:  09/13/24 141 lb (64 kg)  04/11/24 143 lb 6.4 oz (65 kg)  11/04/23 141 lb (64 kg)   Patient is in no distress; well developed, nourished and groomed; neck is supple  MUSCULOSKELETAL: Gait, strength, tone, movements noted in Neurologic exam below  NEUROLOGIC: MENTAL STATUS:      No data to display            09/13/2024    1:27 PM 04/11/2024   11:36 AM 06/23/2023    9:39 AM  Montreal Cognitive Assessment   Visuospatial/ Executive (0/5) 5 5 5   Naming (0/3) 3 3 3   Attention: Read list of digits (0/2) 2 2 2   Attention: Read list of letters (0/1) 1 0 1  Attention: Serial 7 subtraction starting at 100 (0/3) 3 2 1   Language: Repeat phrase (0/2) 2 2 2   Language : Fluency (0/1) 1 1 0  Abstraction (0/2) 2 2 2   Delayed Recall (0/5) 5 0 0  Orientation (0/6) 5 5 4   Total 29 22 20     CRANIAL NERVE:  2nd, 3rd, 4th, 6th- visual fields full to confrontation, extraocular muscles intact, no nystagmus 5th - facial sensation symmetric 7th - facial strength symmetric 8th - hearing intact 9th - palate elevates symmetrically, uvula midline 11th - shoulder shrug symmetric 12th - tongue protrusion midline  MOTOR:  normal bulk and tone, full strength in the BUE, BLE  SENSORY:  normal and symmetric to light touch  COORDINATION:  finger-nose-finger, fine finger movements normal  GAIT/STATION:  normal   DIAGNOSTIC DATA (LABS, IMAGING, TESTING) - I reviewed patient records, labs, notes, testing and imaging myself where available.  Lab Results  Component Value Date   WBC 9.7 10/04/2012   HGB 13.9 10/04/2012   HCT 40.9 10/04/2012   MCV 97.8 10/04/2012   PLT 283 10/04/2012      Component Value Date/Time   NA 136 10/04/2012 1500   K 3.5 10/04/2012 1500   CL 100 10/04/2012 1500   CO2 25 10/04/2012 1500   GLUCOSE 90 10/04/2012 1500   BUN 14 10/04/2012 1500   CREATININE 0.65 10/04/2012 1500   CALCIUM 9.5 10/04/2012 1500  PROT 7.2 10/04/2012 1500    ALBUMIN 3.9 10/04/2012 1500   AST 14 10/04/2012 1500   ALT 10 10/04/2012 1500   ALKPHOS 52 10/04/2012 1500   BILITOT 0.3 10/04/2012 1500   GFRNONAA >90 10/04/2012 1500   GFRAA >90 10/04/2012 1500   No results found for: CHOL, HDL, LDLCALC, LDLDIRECT, TRIG, CHOLHDL No results found for: YHAJ8R Lab Results  Component Value Date   VITAMINB12 623 06/23/2023   Lab Results  Component Value Date   TSH 4.930 (H) 06/23/2023   ATN profile positive for Alzheimer dementia biomarkers.   APOE Genotype E3/E4  CSF Abeta 42/40 ratio low 0.14 (<0.16 increase risk for Alzheimer disease)  Amyloid Pet Scan 10/22/2023 Upon further review and consideration, while there is an underlying white matter pattern, there is loss of the gray-white differentiation within the frontal lobes and temporal lobes. Loss of gray-white differentiation within the frontal lobes and temporal lobes is highly suspicious for pathologic beta amyloid deposition within the cerebral cortex which would be consistent with a diagnosis of Alzheimer's disease pathology   MRI Brain 07/02/2023 - Mild atrophy and mild chronic small vessel ischemic disease.  - No acute findings  ASSESSMENT AND PLAN  68 y.o. year old female with hypertension, hyperlipidemia, mild cognitive impairment due to Alzheimer's disease who is presenting for follow-up. She has started her Kisunla  infusion, so far no adverse reaction and MRI has not shown any amyloid related imaging abnormality, edema or hemorrhage.  Husband tells me that overall she is stable.  Her anxiety has also improved while on Celexa .  Plan will be for patient to continue current treatment, will continue her surveillance MRI and I will see her in 6 months for follow-up or sooner if worse..    1. Mild cognitive impairment (MCI) due to Alzheimer's disease [facility])      Patient Instructions  Continue current  Kisunla  infusion Continue your Ball medications Continue to follow with  your doctors Return in 6 months or sooner if worse  No orders of the defined types were placed in this encounter.   Meds ordered this encounter  Medications   citalopram  (CELEXA ) 20 MG tablet    Sig: Take 1 tablet (20 mg total) by mouth daily.    Dispense:  90 tablet    Refill:  4    Return in about 6 months (around 03/14/2025).   Pastor Falling, MD 09/13/2024, 6:37 PM  [facility] [address] [place], [place] [address] [phone]

## 2024-09-14 DIAGNOSIS — M9901 Segmental and somatic dysfunction of cervical region: Secondary | ICD-10-CM | POA: Diagnosis not present

## 2024-09-14 DIAGNOSIS — M5411 Radiculopathy, occipito-atlanto-axial region: Secondary | ICD-10-CM | POA: Diagnosis not present

## 2024-09-22 DIAGNOSIS — G3184 Mild cognitive impairment, so stated: Secondary | ICD-10-CM | POA: Diagnosis not present

## 2024-09-22 DIAGNOSIS — F0280 Dementia in other diseases classified elsewhere without behavioral disturbance: Secondary | ICD-10-CM | POA: Diagnosis not present

## 2024-09-22 DIAGNOSIS — Z006 Encounter for examination for normal comparison and control in clinical research program: Secondary | ICD-10-CM | POA: Diagnosis not present

## 2024-09-22 DIAGNOSIS — G309 Alzheimer's disease, unspecified: Secondary | ICD-10-CM | POA: Diagnosis not present

## 2025-04-20 ENCOUNTER — Ambulatory Visit: Admitting: Neurology
